# Patient Record
Sex: Female | Born: 1985 | Race: Black or African American | Hispanic: No | Smoking: Never smoker
Health system: Southern US, Community
[De-identification: ages and names within clinical notes are randomized; demographics above are authoritative.]

## PROBLEM LIST (undated history)

## (undated) ENCOUNTER — Inpatient Hospital Stay (HOSPITAL_COMMUNITY): Payer: Self-pay

## (undated) DIAGNOSIS — J45909 Unspecified asthma, uncomplicated: Secondary | ICD-10-CM

## (undated) DIAGNOSIS — A609 Anogenital herpesviral infection, unspecified: Secondary | ICD-10-CM

## (undated) DIAGNOSIS — O24419 Gestational diabetes mellitus in pregnancy, unspecified control: Secondary | ICD-10-CM

## (undated) DIAGNOSIS — I10 Essential (primary) hypertension: Secondary | ICD-10-CM

## (undated) HISTORY — PX: WISDOM TOOTH EXTRACTION: SHX21

---

## 2015-08-11 ENCOUNTER — Emergency Department (HOSPITAL_COMMUNITY)
Admission: EM | Admit: 2015-08-11 | Discharge: 2015-08-12 | Disposition: A | Discharge status: Home / Self Care | Payer: MEDICAID | Attending: Emergency Medicine | Admitting: Emergency Medicine

## 2015-08-11 ENCOUNTER — Emergency Department (HOSPITAL_COMMUNITY): Payer: MEDICAID

## 2015-08-11 ENCOUNTER — Encounter (HOSPITAL_COMMUNITY): Payer: Self-pay | Admitting: Emergency Medicine

## 2015-08-11 DIAGNOSIS — J45901 Unspecified asthma with (acute) exacerbation: Secondary | ICD-10-CM | POA: Insufficient documentation

## 2015-08-11 DIAGNOSIS — M79603 Pain in arm, unspecified: Secondary | ICD-10-CM | POA: Insufficient documentation

## 2015-08-11 DIAGNOSIS — Z880 Allergy status to penicillin: Secondary | ICD-10-CM | POA: Diagnosis not present

## 2015-08-11 DIAGNOSIS — I10 Essential (primary) hypertension: Secondary | ICD-10-CM | POA: Diagnosis not present

## 2015-08-11 DIAGNOSIS — R51 Headache: Secondary | ICD-10-CM | POA: Insufficient documentation

## 2015-08-11 DIAGNOSIS — F419 Anxiety disorder, unspecified: Secondary | ICD-10-CM | POA: Diagnosis not present

## 2015-08-11 DIAGNOSIS — R079 Chest pain, unspecified: Secondary | ICD-10-CM | POA: Diagnosis present

## 2015-08-11 DIAGNOSIS — M25519 Pain in unspecified shoulder: Secondary | ICD-10-CM | POA: Diagnosis not present

## 2015-08-11 DIAGNOSIS — F410 Panic disorder [episodic paroxysmal anxiety] without agoraphobia: Secondary | ICD-10-CM

## 2015-08-11 HISTORY — DX: Essential (primary) hypertension: I10

## 2015-08-11 HISTORY — DX: Unspecified asthma, uncomplicated: J45.909

## 2015-08-11 MED ORDER — IBUPROFEN 800 MG PO TABS
800.0000 mg | ORAL_TABLET | Freq: Once | ORAL | Status: AC
  Administered 2015-08-12: 800 mg via ORAL
  Filled 2015-08-11: qty 1

## 2015-08-11 NOTE — ED Notes (Signed)
Delay in blood draw, pt in radiology 

## 2015-08-11 NOTE — ED Notes (Signed)
Pt states she was stressed out tonight and began having chest pain. R sided chest numbness. SOB before but not now. Alert and oriented.
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
## 2015-08-12 LAB — I-STAT TROPONIN, ED: TROPONIN I, POC: 0 ng/mL (ref 0.00–0.08)

## 2015-08-12 MED ORDER — ALBUTEROL SULFATE HFA 108 (90 BASE) MCG/ACT IN AERS
2.0000 | INHALATION_SPRAY | RESPIRATORY_TRACT | Status: DC | PRN
  Administered 2015-08-12: 2 via RESPIRATORY_TRACT
  Filled 2015-08-12: qty 6.7

## 2015-08-12 NOTE — Discharge Instructions (Signed)
Your symptoms are likely due to stress or an anxiety attack.  It is unlikely to be due to heart complication.  Please use resources below to find a primary care provider for your regular health maintenance.  Use albuterol inhaler as needed for shortness of breath or wheezing.  Return to ER if your condition worsen or if you have other concerns.  Emergency Department Resource Guide 1) Find a Doctor and Pay Out of Pocket Although you won't have to find out who is covered by your insurance plan, it is a good idea to ask around and get recommendations. You will then need to call the office and see if the doctor you have chosen will accept you as a new patient and what types of options they offer for patients who are self-pay. Some doctors offer discounts or will set up payment plans for their patients who do not have insurance, but you will need to ask so you aren't surprised when you get to your appointment.  2) Contact Your Local Health Department Not all health departments have doctors that can see patients for sick visits, but many do, so it is worth a call to see if yours does. If you don't know where your local health department is, you can check in your phone book. The CDC also has a tool to help you locate your state's health department, and many state websites also have listings of all of their local health departments.  3) Find a Walk-in Clinic If your illness is not likely to be very severe or complicated, you may want to try a walk in clinic. These are popping up all over the country in pharmacies, drugstores, and shopping centers. They're usually staffed by nurse practitioners or physician assistants that have been trained to treat common illnesses and complaints. They're usually fairly quick and inexpensive. However, if you have serious medical issues or chronic medical problems, these are probably not your best option.  No Primary Care Doctor: - Call Health Connect at  [phone] - they can  help you locate a primary care doctor that  accepts your insurance, provides certain services, etc. - Physician Referral Service- [phone]  Chronic Pain Problems: Organization         Address  Phone   Notes  [facility]  [phone]) [phone] Patients need to be referred by their primary care doctor.   Medication Assistance: Organization         Address  Phone   Notes  [facility]ication [facility] [address] [place], [place] [address] [phone]) [phone] --Must be a resident of [facility] [place] -- Must have NO insurance coverage whatsoever (no Medicaid/ Medicare, etc.) -- The pt. MUST have a primary care doctor that directs their care regularly and follows them in the community   MedAssist  [phone]) [phone]   [employer]  [phone]) [phone]    Agencies that provide inexpensive medical care: Organization         Address  Phone   Notes  [facility]  [phone]   [facility]    [phone]   [facility] [address] [place], [place] [address] [phone]) [phone]   [facility] [address], [place] [phone]) [phone]   [facility]anned Parenthood    [phone](336) [phone]   [facility]    [phone](336) [phone]   [facility]  [address], [place] Phone:  [phone], Fax:  [phone] Hours of Operation:  9 am - 6 pm, M-F.  Also accepts Medicaid/Medicare and self-pay.  [facility] for [place], [address], [place] Phone: [phone], Fax: [phone]. Hours of Operation:  8:30 am - 5:30 pm, M-F.  Also accepts Medicaid and self-pay.  [facility] [address] [place] Phone: [phone]   [place], [place], [place] [phone], Ext. [phone] Mondays & Thursdays: 7-9 AM.  First 15 patients are seen on a first come, first serve basis.    [place] Providers:  Organization         Address  Phone   Notes  [facility] [address], [place] [phone] Also accepts self-pay patients.  [facility] [id number] [place], [place]  [phone]   [place], [address], [place] [phone]   [facility] [address] [place] [phone]   Lucianne Lei [address], [place]   [phone] Only accepts [place] Access [place] patients after they have their name applied to their card.   Self-Pay (no insurance) in [facility]:  Organization         Address  Phone   Notes  Sickle Cell Patients, [facility] [place] [phone]   [facility] [place] [phone]   [facility] [place]  [place], [place], [place] [phone]   [facility]/Dr. Osei-Bonsu  [address], [place] or [address], [place] [phone] Phone number for both [place] and [place] locations is the same.  Urgent Medical and [facility] [address] [place] [phone]   [facility] [address] [place] or [address] [phone]   [facility] [address], [place] [phone], phone; [phone], fax Sees patients 1st and 3rd Saturday of every month.  Must not qualify for public or private insurance (i.e. Medicaid, Medicare, [place] Health Choice, Veterans' Benefits)  Household income should be no more than 200% of the poverty level The clinic cannot treat you if you are pregnant or think you are pregnant  Sexually transmitted diseases are not treated at the clinic.    Dental Care: Organization         Address  Phone  Notes  [facility] [place] [phone] Accepts children up to age 31 who are enrolled in [place] or [place]; pregnant women with a Medicaid card; and children who have applied for Medicaid or [place] Health Choice, but were declined, whose parents can pay a reduced fee at time of service.  [facility] of [facility]  [address], [place] [phone] Accepts children up to age 34 who are enrolled in [place] or [place]; pregnant women with a Medicaid card; and children who have applied for Medicaid or [place] Health Choice, but were declined, whose parents can pay a reduced fee at time of service.  [place] Adult Dental Access PROGRAM  [place] [phone] Patients are seen by appointment only. Walk-ins  are not accepted. [place] will see patients 33 years of age and older. Monday - Tuesday (8am-5pm) Most Wednesdays (8:30-5pm) $30 per visit, cash only  [facility] Adult [facility] Access PROGRAM  [address], [facility] [phone] Patients are seen by appointment only. Walk-ins are not accepted. [place] will see patients 17 years of age and older. One Wednesday Evening (Monthly: Volunteer Based).  $30 per visit, cash only  [place]  [phone] for adults; Children under age 66, call Graduate Pediatric Dentistry at [phone]. Children aged 52-14, please call [phone] to request a pediatric application.  Dental services are provided in all areas of dental care including fillings, crowns and bridges, complete and partial dentures, implants, gum treatment, root canals, and extractions. Preventive care is also provided. Treatment is provided to both adults and children. Patients are selected via a lottery and there is often a waiting list.   [facility] [address] [place]  [phone] [email or web address]   [facility] [address] [place], [place] [phone], Ext. [phone] Second and Fourth Thursday of each month, opens at 6:30 AM; Clinic ends at 9 AM.  Patients are seen on a first-come first-served basis, and a limited number are seen during each clinic.   [facility]  [address] Hillard Danker [place], [place] [phone]   Eligibility Requirements You must have lived in [place], [place], or [place] counties for at least the last three months.   You cannot be eligible for state or federal sponsored [employer], including [employer], [place], or [employer].   You generally cannot be eligible for healthcare insurance through your employer.    How to apply: Eligibility screenings are held every Tuesday and Wednesday afternoon from 1:00 pm until 4:00 pm. You do not need an appointment for the interview!  [facility] [address], [place], [place]   [place]  [place] Department  [place]  [phone]    Behavioral Health Resources in the Community: Intensive Outpatient Programs Organization         Address  Phone  Notes  [place]. [address] [place], [place] [phone]   [facility] Outpatient [address] [place], [place]   ADS: Alcohol & Drug Svcs [address] [place], [place]   [place] [address],  [place], [place] or [phone]   Substance Abuse Resources Organization         Address  Phone  Notes  Alcohol and Drug Services  [phone]   [place]  [phone]   [facility] [place]   Chinita Pester  [phone]   Residential & Outpatient Substance Abuse Program  [phone]   Psychological Services Organization         Address  Phone  Notes  [facility] [place]  [place]  [phone]   [place] [address], [place] [phone] or [phone]    Mobile Crisis Teams Organization         Address  Phone  Notes  [facility]rapeutic Alternatives, Mobile Crisis Care Unit  [phone]   Assertive Psychotherapeutic Services  [address] [place], [place]   [facility] [address], [place] [phone]  Self-Help/Support Groups Organization         Address  Phone             Notes  Mental Health Assoc. of [place] - variety of support groups  [place] Call for more information  Narcotics Anonymous (NA), [facility] [address], [employer] [place]  2 meetings at this location   Special educational needs teacher         Address  Phone  Notes  ASAP Residential Treatment [place],    [place]  [phone]   [facility]  [address], [place] [address]27, [place], [place]   [place], [place] [phone] Admissions: 8am-3pm M-F  Incentives Substance [place] [address],    [employer], [place] [phone]   [facility] [address] [place], [place], [place]   [facility] [address]  [place], [place]   Insight Programs - Intensive Outpatient [address], [place], [place]   [facility] ([place] [place].,  [place], [place] [phone] or [phone]   [facility]idential Treatment Services (RTS) [address], [place], [place] Accepts Medicaid  [facility] [place] [address],  [place] [phone] Substance Abuse/Addiction Treatment   [facility] Organization         Address  Phone  Notes  [facility]  [phone]   Domenic Schwab, PhD [address] [place], [place]   [phone] or  [phone]   [place], [place] [phone]   [facility] [address], [place], [place] [phone] Insurance/Medicaid/sponsorship through [facility] and Families [address] [place]                                    [place], [place] [phone] [place] [address], [place] [phone]    Dr. Adele Schilder  [phone]   [facility] Dept. 1) [address], [place] 2) [place] 3)  [place] [address], [place] [phone]  [phone]   [place] [phone] or [phone] (After Hours)

## 2015-09-09 ENCOUNTER — Emergency Department (HOSPITAL_COMMUNITY)
Admission: EM | Admit: 2015-09-09 | Discharge: 2015-09-09 | Discharge status: Against Medical Advice | Payer: Medicaid Other | Attending: Emergency Medicine | Admitting: Emergency Medicine

## 2015-09-09 ENCOUNTER — Encounter (HOSPITAL_COMMUNITY): Payer: Self-pay | Admitting: Emergency Medicine

## 2015-09-09 DIAGNOSIS — R112 Nausea with vomiting, unspecified: Secondary | ICD-10-CM | POA: Insufficient documentation

## 2015-09-09 DIAGNOSIS — J45909 Unspecified asthma, uncomplicated: Secondary | ICD-10-CM | POA: Diagnosis not present

## 2015-09-09 DIAGNOSIS — R1030 Lower abdominal pain, unspecified: Secondary | ICD-10-CM | POA: Diagnosis not present

## 2015-09-09 DIAGNOSIS — R1010 Upper abdominal pain, unspecified: Secondary | ICD-10-CM | POA: Diagnosis not present

## 2015-09-09 DIAGNOSIS — I10 Essential (primary) hypertension: Secondary | ICD-10-CM | POA: Diagnosis not present

## 2015-09-09 LAB — CBC
HEMATOCRIT: 34.5 % — AB (ref 36.0–46.0)
HEMOGLOBIN: 12.4 g/dL (ref 12.0–15.0)
MCH: 32.2 pg (ref 26.0–34.0)
MCHC: 35.9 g/dL (ref 30.0–36.0)
MCV: 89.6 fL (ref 78.0–100.0)
PLATELETS: 242 10*3/uL (ref 150–400)
RBC: 3.85 MIL/uL — ABNORMAL LOW (ref 3.87–5.11)
RDW: 12.2 % (ref 11.5–15.5)
WBC: 5.1 10*3/uL (ref 4.0–10.5)

## 2015-09-09 LAB — URINALYSIS, ROUTINE W REFLEX MICROSCOPIC
GLUCOSE, UA: NEGATIVE mg/dL
Hgb urine dipstick: NEGATIVE
KETONES UR: 15 mg/dL — AB
Nitrite: NEGATIVE
PROTEIN: 30 mg/dL — AB
Specific Gravity, Urine: 1.039 — ABNORMAL HIGH (ref 1.005–1.030)
pH: 5.5 (ref 5.0–8.0)

## 2015-09-09 LAB — URINE MICROSCOPIC-ADD ON

## 2015-09-09 LAB — COMPREHENSIVE METABOLIC PANEL
ALT: 12 U/L — ABNORMAL LOW (ref 14–54)
ANION GAP: 10 (ref 5–15)
AST: 16 U/L (ref 15–41)
Albumin: 3.8 g/dL (ref 3.5–5.0)
Alkaline Phosphatase: 57 U/L (ref 38–126)
BILIRUBIN TOTAL: 0.8 mg/dL (ref 0.3–1.2)
BUN: 9 mg/dL (ref 6–20)
CO2: 24 mmol/L (ref 22–32)
Calcium: 9 mg/dL (ref 8.9–10.3)
Chloride: 106 mmol/L (ref 101–111)
Creatinine, Ser: 0.72 mg/dL (ref 0.44–1.00)
GFR calc Af Amer: >60 mL/min (ref >60)
Glucose, Bld: 98 mg/dL (ref 65–99)
POTASSIUM: 3.6 mmol/L (ref 3.5–5.1)
Sodium: 140 mmol/L (ref 135–145)
TOTAL PROTEIN: 6.8 g/dL (ref 6.5–8.1)

## 2015-09-09 LAB — I-STAT BETA HCG BLOOD, ED (MC, WL, AP ONLY): HCG, QUANTITATIVE: 1366.9 m[IU]/mL — AB (ref <5)

## 2015-09-09 LAB — LIPASE, BLOOD: Lipase: 29 U/L (ref 11–51)

## 2015-09-09 NOTE — ED Notes (Signed)
Pt states for the last week she has been having lower abd cramping and upper abd pain. Pt also states she has been having n/v. Pt thinks she is pregnant unknown how far along or when last menstrual period was.

## 2015-11-17 LAB — OB RESULTS CONSOLE RUBELLA ANTIBODY, IGM: Rubella: IMMUNE

## 2015-11-17 LAB — OB RESULTS CONSOLE HIV ANTIBODY (ROUTINE TESTING): HIV: NONREACTIVE

## 2015-11-17 LAB — OB RESULTS CONSOLE ABO/RH: RH TYPE: NEGATIVE

## 2015-11-17 LAB — OB RESULTS CONSOLE RPR: RPR: NONREACTIVE

## 2015-11-17 LAB — OB RESULTS CONSOLE HEPATITIS B SURFACE ANTIGEN: HEP B S AG: NEGATIVE

## 2015-11-17 LAB — OB RESULTS CONSOLE GC/CHLAMYDIA
Chlamydia: NEGATIVE
Gonorrhea: NEGATIVE

## 2015-11-17 LAB — OB RESULTS CONSOLE ANTIBODY SCREEN: ANTIBODY SCREEN: NEGATIVE

## 2015-12-17 ENCOUNTER — Inpatient Hospital Stay (HOSPITAL_COMMUNITY): Payer: Medicaid Other

## 2015-12-17 ENCOUNTER — Encounter (HOSPITAL_COMMUNITY): Payer: Self-pay | Admitting: *Deleted

## 2015-12-17 ENCOUNTER — Inpatient Hospital Stay (HOSPITAL_COMMUNITY)
Admission: AD | Admit: 2015-12-17 | Discharge: 2015-12-17 | Disposition: A | Discharge status: Home / Self Care | Payer: Medicaid Other | Source: Ambulatory Visit | Attending: Obstetrics and Gynecology | Admitting: Obstetrics and Gynecology

## 2015-12-17 DIAGNOSIS — M549 Dorsalgia, unspecified: Secondary | ICD-10-CM | POA: Diagnosis not present

## 2015-12-17 DIAGNOSIS — Z3A19 19 weeks gestation of pregnancy: Secondary | ICD-10-CM | POA: Diagnosis not present

## 2015-12-17 DIAGNOSIS — R102 Pelvic and perineal pain: Secondary | ICD-10-CM | POA: Insufficient documentation

## 2015-12-17 DIAGNOSIS — Z880 Allergy status to penicillin: Secondary | ICD-10-CM | POA: Diagnosis not present

## 2015-12-17 DIAGNOSIS — R58 Hemorrhage, not elsewhere classified: Secondary | ICD-10-CM

## 2015-12-17 DIAGNOSIS — O26852 Spotting complicating pregnancy, second trimester: Secondary | ICD-10-CM | POA: Diagnosis present

## 2015-12-17 DIAGNOSIS — O4692 Antepartum hemorrhage, unspecified, second trimester: Secondary | ICD-10-CM

## 2015-12-17 LAB — WET PREP, GENITAL
CLUE CELLS WET PREP: NONE SEEN
Sperm: NONE SEEN
TRICH WET PREP: NONE SEEN
Yeast Wet Prep HPF POC: NONE SEEN

## 2015-12-17 MED ORDER — IBUPROFEN 600 MG PO TABS
600.0000 mg | ORAL_TABLET | Freq: Once | ORAL | Status: AC | PRN
  Administered 2015-12-17: 600 mg via ORAL
  Filled 2015-12-17: qty 1

## 2015-12-17 NOTE — MAU Provider Note (Signed)
Katherine Serrano, Katherine Serrano is a  29yo, [id number], at 19.4wks presenting to MAU unannounced for vaginal bleeding two days ago and lower abdominal sharp pain that is worse with sudden movement.  Reports sexual intercourse a month ago.   Denies contractions or lof.  Reports some discharge that smells "like blood".    History     There are no active problems to display for this patient.   Chief Complaint  Patient presents with  . Back Pain  . Abdominal Pain   HPI  OB History    Gravida Para Term Preterm AB TAB SAB Ectopic Multiple Living   Past Medical History  Diagnosis Date  . Asthma     Past Surgical History  Procedure Laterality Date  . No past surgeries      History reviewed. No pertinent family history.  Social History  Substance Use Topics  . Smoking status: Never Smoker   . Smokeless tobacco: None  . Alcohol Use: No    Allergies:  Allergies  Allergen Reactions  . Amoxicillin Anaphylaxis and Hives    Has patient had a PCN reaction causing immediate rash, facial/tongue/throat swelling, SOB or lightheadedness with hypotension: Yes Has patient had a PCN reaction causing severe rash involving mucus membranes or skin necrosis: No Has patient had a PCN reaction that required hospitalization Yes Has patient had a PCN reaction occurring within the last 10 years: Yes If all of the above answers are "NO", then may proceed with Cephalosporin use.   Marland Kitchen Penicillins     No prescriptions prior to admission    ROS Physical Exam   Blood pressure 107/63, pulse 55, temperature 97.7 F (36.5 C), temperature source Oral, resp. rate 18, SpO2 99 %.   Results for orders placed or performed during the hospital encounter of 12/17/15 (from the past 24 hour(s))  Wet prep, genital     Status: Abnormal   Collection Time: 12/17/15  5:22 PM  Result Value Ref Range   Yeast Wet Prep HPF POC NONE SEEN NONE SEEN   Trich, Wet Prep NONE SEEN NONE SEEN   Clue Cells Wet Prep  HPF POC NONE SEEN NONE SEEN   WBC, Wet Prep HPF POC MANY (A) NONE SEEN   Sperm NONE SEEN     [place]:   Cephalic, Anterior placenta above cervical os,   AFI subjectively within normal limits. No placental abruption or previa identified, gesational age [redacted]w[redacted]d, cervix 3.2 cm SVE:  0/0/-4, no blood noted in vaginal vault with sterile speculum exam UC : None FHT: 148 bpm, per doppler   Physical Exam  Constitutional: She is oriented to person, place, and time. She appears well-developed.  HENT:  Head: Normocephalic.  Eyes: Pupils are equal, round, and reactive to light.  Neck: Normal range of motion.  Cardiovascular: Normal rate and regular rhythm.   Respiratory: Effort normal.  GI: Soft.  Genitourinary: Vagina normal.  Musculoskeletal: Normal range of motion.  Neurological: She is alert and oriented to person, place, and time.  Skin: Skin is warm and dry.  Psychiatric: She has a normal mood and affect. Her behavior is normal. Judgment and thought content normal.    ED Course  Assessment:  IUP 19.4 wks  Round ligament pain Vaginal spotting FHT 145 bpm via doppler No contractions Wet prep negative [place]: no placental previa or abruption identified  Plan: DC home Bleeding precautions  Go to scheduled prenatal visit Call  PRN  Tylenol 650mg  PRN for pain   Alphonzo SeveranceRachel Marwa Fuhrman CNM, MSN 12/17/2015 8:24 PM

## 2015-12-17 NOTE — Discharge Instructions (Signed)
Vaginal Bleeding During Pregnancy, Second Trimester A small amount of bleeding (spotting) from the vagina is relatively common in pregnancy. It usually stops on its own. Various things can cause bleeding or spotting in pregnancy. Some bleeding may be related to the pregnancy, and some may not. Sometimes the bleeding is normal and is not a problem. However, bleeding can also be a sign of something serious. Be sure to tell your health care provider about any vaginal bleeding right away. Some possible causes of vaginal bleeding during the second trimester include:  Infection, inflammation, or growths on the cervix.   The placenta may be partially or completely covering the opening of the cervix inside the uterus (placenta previa).  The placenta may have separated from the uterus (abruption of the placenta).   You may be having early (preterm) labor.   The cervix may not be strong enough to keep a baby inside the uterus (cervical insufficiency).   Tiny cysts may have developed in the uterus instead of pregnancy tissue (molar pregnancy). HOME CARE INSTRUCTIONS  Watch your condition for any changes. The following actions may help to lessen any discomfort you are feeling:  Follow your health care provider's instructions for limiting your activity. If your health care provider orders bed rest, you may need to stay in bed and only get up to use the bathroom. However, your health care provider may allow you to continue light activity.  If needed, make plans for someone to help with your regular activities and responsibilities while you are on bed rest.  Keep track of the number of pads you use each day, how often you change pads, and how soaked (saturated) they are. Write this down.  Do not use tampons. Do not douche.  Do not have sexual intercourse or orgasms until approved by your health care provider.  If you pass any tissue from your vagina, save the tissue so you can show it to your  health care provider.  Only take over-the-counter or prescription medicines as directed by your health care provider.  Do not take aspirin because it can make you bleed.  Do not exercise or perform any strenuous activities or heavy lifting without your health care provider's permission.  Keep all follow-up appointments as directed by your health care provider. SEEK MEDICAL CARE IF:  You have any vaginal bleeding during any part of your pregnancy.  You have cramps or labor pains.  You have a fever, not controlled by medicine. SEEK IMMEDIATE MEDICAL CARE IF:   You have severe cramps in your back or belly (abdomen).  You have contractions.  You have chills.  You pass large clots or tissue from your vagina.  Your bleeding increases.  You feel light-headed or weak, or you have fainting episodes.  You are leaking fluid or have a gush of fluid from your vagina. MAKE SURE YOU:  Understand these instructions.  Will watch your condition.  Will get help right away if you are not doing well or get worse.   This information is not intended to replace advice given to you by your health care provider. Make sure you discuss any questions you have with your health care provider.   Document Released: 05/24/2005 Document Revised: 08/19/2013 Document Reviewed: 04/21/2013 Elsevier Interactive Patient Education 2016 [employer] Inc[employer]  

## 2015-12-17 NOTE — MAU Note (Signed)
Pt states she has been experiencing lower abd and lower back pain like cramping for several days.  Pt states she had vaginal spotting three days ago.  Increased vaginal discharge without odor, itching or burning.  Pt states she feels flutters in abd, no movement yet.

## 2015-12-20 LAB — GC/CHLAMYDIA PROBE AMP (~~LOC~~) NOT AT ARMC
Chlamydia: NEGATIVE
NEISSERIA GONORRHEA: NEGATIVE

## 2015-12-23 ENCOUNTER — Encounter (HOSPITAL_COMMUNITY): Payer: Self-pay | Admitting: Emergency Medicine

## 2015-12-23 ENCOUNTER — Encounter (HOSPITAL_COMMUNITY): Payer: Self-pay

## 2016-03-27 ENCOUNTER — Encounter (HOSPITAL_COMMUNITY): Payer: Self-pay | Admitting: *Deleted

## 2016-03-27 ENCOUNTER — Inpatient Hospital Stay (HOSPITAL_COMMUNITY)
Admission: AD | Admit: 2016-03-27 | Discharge: 2016-03-27 | Disposition: A | Discharge status: Home / Self Care | Payer: Medicaid Other | Source: Ambulatory Visit | Attending: Obstetrics and Gynecology | Admitting: Obstetrics and Gynecology

## 2016-03-27 ENCOUNTER — Encounter: Payer: Self-pay | Admitting: Advanced Practice Midwife

## 2016-03-27 DIAGNOSIS — Z3A32 32 weeks gestation of pregnancy: Secondary | ICD-10-CM | POA: Diagnosis not present

## 2016-03-27 DIAGNOSIS — O4703 False labor before 37 completed weeks of gestation, third trimester: Secondary | ICD-10-CM | POA: Insufficient documentation

## 2016-03-27 DIAGNOSIS — O9989 Other specified diseases and conditions complicating pregnancy, childbirth and the puerperium: Secondary | ICD-10-CM

## 2016-03-27 DIAGNOSIS — Z880 Allergy status to penicillin: Secondary | ICD-10-CM | POA: Insufficient documentation

## 2016-03-27 DIAGNOSIS — N898 Other specified noninflammatory disorders of vagina: Secondary | ICD-10-CM | POA: Insufficient documentation

## 2016-03-27 DIAGNOSIS — M549 Dorsalgia, unspecified: Secondary | ICD-10-CM

## 2016-03-27 DIAGNOSIS — O26893 Other specified pregnancy related conditions, third trimester: Secondary | ICD-10-CM | POA: Insufficient documentation

## 2016-03-27 LAB — URINE MICROSCOPIC-ADD ON: RBC / HPF: NONE SEEN RBC/hpf (ref 0–5)

## 2016-03-27 LAB — URINALYSIS, ROUTINE W REFLEX MICROSCOPIC
BILIRUBIN URINE: NEGATIVE
GLUCOSE, UA: NEGATIVE mg/dL
HGB URINE DIPSTICK: NEGATIVE
KETONES UR: NEGATIVE mg/dL
Nitrite: NEGATIVE
PROTEIN: NEGATIVE mg/dL
Specific Gravity, Urine: 1.025 (ref 1.005–1.030)
pH: 6 (ref 5.0–8.0)

## 2016-03-27 LAB — WET PREP, GENITAL
Clue Cells Wet Prep HPF POC: NONE SEEN
Sperm: NONE SEEN
Trich, Wet Prep: NONE SEEN
Yeast Wet Prep HPF POC: NONE SEEN

## 2016-03-27 LAB — AMNISURE RUPTURE OF MEMBRANE (ROM) NOT AT ARMC: Amnisure ROM: NEGATIVE

## 2016-03-27 NOTE — Discharge Instructions (Signed)
Braxton Hicks Contractions °Contractions of the uterus can occur throughout pregnancy. Contractions are not always a sign that you are in labor.  °WHAT ARE BRAXTON HICKS CONTRACTIONS?  °Contractions that occur before labor are called Braxton Hicks contractions, or false labor. Toward the end of pregnancy (32-34 weeks), these contractions can develop more often and may become more forceful. This is not true labor because these contractions do not result in opening (dilatation) and thinning of the cervix. They are sometimes difficult to tell apart from true labor because these contractions can be forceful and people have different pain tolerances. You should not feel embarrassed if you go to the hospital with false labor. Sometimes, the only way to tell if you are in true labor is for your health care provider to look for changes in the cervix. °If there are no prenatal problems or other health problems associated with the pregnancy, it is completely safe to be sent home with false labor and await the onset of true labor. °HOW CAN YOU TELL THE DIFFERENCE BETWEEN TRUE AND FALSE LABOR? °False Labor °· The contractions of false labor are usually shorter and not as hard as those of true labor.   °· The contractions are usually irregular.   °· The contractions are often felt in the front of the lower abdomen and in the groin.   °· The contractions may go away when you walk around or change positions while lying down.   °· The contractions get weaker and are shorter lasting as time goes on.   °· The contractions do not usually become progressively stronger, regular, and closer together as with true labor.   °True Labor °· Contractions in true labor last 30-70 seconds, become very regular, usually become more intense, and increase in frequency.   °· The contractions do not go away with walking.   °· The discomfort is usually felt in the top of the uterus and spreads to the lower abdomen and low back.   °· True labor can be  determined by your health care provider with an exam. This will show that the cervix is dilating and getting thinner.   °WHAT TO REMEMBER °· Keep up with your usual exercises and follow other instructions given by your health care provider.   °· Take medicines as directed by your health care provider.   °· Keep your regular prenatal appointments.   °· Eat and drink lightly if you think you are going into labor.   °· If Braxton Hicks contractions are making you uncomfortable:   °· Change your position from lying down or resting to walking, or from walking to resting.   °· Sit and rest in a tub of warm water.   °· Drink 2-3 glasses of water. Dehydration may cause these contractions.   °· Do slow and deep breathing several times an hour.   °WHEN SHOULD I SEEK IMMEDIATE MEDICAL CARE? °Seek immediate medical care if: °· Your contractions become stronger, more regular, and closer together.   °· You have fluid leaking or gushing from your vagina.   °· You have a fever.   °· You pass blood-tinged mucus.   °· You have vaginal bleeding.   °· You have continuous abdominal pain.   °· You have low back pain that you never had before.   °· You feel your baby's head pushing down and causing pelvic pressure.   °· Your baby is not moving as much as it used to.   °  °This information is not intended to replace advice given to you by your health care provider. Make sure you discuss any questions you have with your health care   provider. °  °Document Released: 08/14/2005 Document Revised: 08/19/2013 Document Reviewed: 05/26/2013 °Elsevier Interactive Patient Education ©2016 [employer] ° °Back Pain in Pregnancy °Back pain during pregnancy is common. It happens in about half of all pregnancies. It is important for you and your baby that you remain active during your pregnancy. If you feel that back pain is not allowing you to remain active or sleep well, it is time to see your caregiver. Back pain may be caused by several factors  related to changes during your pregnancy. Fortunately, unless you had trouble with your back before your pregnancy, the pain is likely to get better after you deliver. °Low back pain usually occurs between the fifth and seventh months of pregnancy. It can, however, happen in the first couple months. Factors that increase the risk of back problems include:  °· Previous back problems. °· Injury to your back. °· Having twins or multiple births. °· A chronic cough. °· Stress. °· Job-related repetitive motions. °· Muscle or spinal disease in the back. °· Family history of back problems, ruptured (herniated) discs, or osteoporosis. °· Depression, anxiety, and panic attacks. °CAUSES  °· When you are pregnant, your body produces a hormone called relaxin. This hormone makes the ligaments connecting the low back and pubic bones more flexible. This flexibility allows the baby to be delivered more easily. When your ligaments are loose, your muscles need to work harder to support your back. Soreness in your back can come from tired muscles. Soreness can also come from back tissues that are irritated since they are receiving less support. °· As the baby grows, it puts pressure on the nerves and blood vessels in your pelvis. This can cause back pain. °· As the baby grows and gets heavier during pregnancy, the uterus pushes the stomach muscles forward and changes your center of gravity. This makes your back muscles work harder to maintain good posture. °SYMPTOMS  °Lumbar pain during pregnancy °Lumbar pain during pregnancy usually occurs at or above the waist in the center of the back. There may be pain and numbness that radiates into your leg or foot. This is similar to low back pain experienced by non-pregnant women. It usually increases with sitting for long periods of time, standing, or repetitive lifting. Tenderness may also be present in the muscles along your upper back. °Posterior pelvic pain during pregnancy °Pain in the  back of the pelvis is more common than lumbar pain in pregnancy. It is a deep pain felt in your side at the waistline, or across the tailbone (sacrum), or in both places. You may have pain on one or both sides. This pain can also go into the buttocks and backs of the upper thighs. Pubic and groin pain may also be present. The pain does not quickly resolve with rest, and morning stiffness may also be present. °Pelvic pain during pregnancy can be brought on by most activities. A high level of fitness before and during pregnancy may or may not prevent this problem. Labor pain is usually 1 to 2 minutes apart, lasts for about 1 minute, and involves a bearing down feeling or pressure in your pelvis. However, if you are at term with the pregnancy, constant low back pain can be the beginning of early labor, and you should be aware of this. °DIAGNOSIS  °X-rays of the back should not be done during the first 12 to 14 weeks of the pregnancy and only when absolutely necessary during the rest of the pregnancy. MRIs do not give   off radiation and are safe during pregnancy. MRIs also should only be done when absolutely necessary. °HOME CARE INSTRUCTIONS °· Exercise as directed by your caregiver. Exercise is the most effective way to prevent or manage back pain. If you have a back problem, it is especially important to avoid sports that require sudden body movements. Swimming and walking are great activities. °· Do not stand in one place for long periods of time. °· Do not wear high heels. °· Sit in chairs with good posture. Use a pillow on your lower back if necessary. Make sure your head rests over your shoulders and is not hanging forward. °· Try sleeping on your side, preferably the left side, with a pillow or two between your legs. If you are sore after a night's rest, your bed may be too soft. Try placing a board between your mattress and box spring. °· Listen to your body when lifting. If you are experiencing pain, ask for  help or try bending your knees more so you can use your leg muscles rather than your back muscles. Squat down when picking up something from the floor. Do not bend over. °· Eat a healthy diet. Try to gain weight within your caregiver's recommendations. °· Use heat or cold packs 3 to 4 times a day for 15 minutes to help with the pain. °· Only take over-the-counter or prescription medicines for pain, discomfort, or fever as directed by your caregiver. °Sudden (acute) back pain °· Use bed rest for only the most extreme, acute episodes of back pain. Prolonged bed rest over 48 hours will aggravate your condition. °· Ice is very effective for acute conditions. °¨ Put ice in a plastic bag. °¨ Place a towel between your skin and the bag. °¨ Leave the ice on for 10 to 20 minutes every 2 hours, or as needed. °· Using heat packs for 30 minutes prior to activities is also helpful. °Continued back pain °See your caregiver if you have continued problems. Your caregiver can help or refer you for appropriate physical therapy. With conditioning, most back problems can be avoided. Sometimes, a more serious issue may be the cause of back pain. You should be seen right away if new problems seem to be developing. Your caregiver may recommend: °· A maternity girdle. °· An elastic sling. °· A back brace. °· A massage therapist or acupuncture. °SEEK MEDICAL CARE IF:  °· You are not able to do most of your daily activities, even when taking the pain medicine you were given. °· You need a referral to a physical therapist or chiropractor. °· You want to try acupuncture. °SEEK IMMEDIATE MEDICAL CARE IF: °· You develop numbness, tingling, weakness, or problems with the use of your arms or legs. °· You develop severe back pain that is no longer relieved with medicines. °· You have a sudden change in bowel or bladder control. °· You have increasing pain in other areas of the body. °· You develop shortness of breath, dizziness, or fainting. °· You  develop nausea, vomiting, or sweating. °· You have back pain which is similar to labor pains. °· You have back pain along with your water breaking or vaginal bleeding. °· You have back pain or numbness that travels down your leg. °· Your back pain developed after you fell. °· You develop pain on one side of your back. You may have a kidney stone. °· You see blood in your urine. You may have a bladder infection or kidney stone. °· You have back pain with   blisters. You may have shingles. °Back pain is fairly common during pregnancy but should not be accepted as just part of the process. Back pain should always be treated as soon as possible. This will make your pregnancy as pleasant as possible. °  °This information is not intended to replace advice given to you by your health care provider. Make sure you discuss any questions you have with your health care provider. °  °Document Released: 11/22/2005 Document Revised: 11/06/2011 Document Reviewed: 01/03/2011 °Elsevier Interactive Patient Education ©2016 Elsevier Inc. ° ° °

## 2016-03-27 NOTE — MAU Note (Signed)
Pt C/O lower abd & back pain since last week, has been taking tylenol PM, wasn't helping.  Was advised to come in last week but did not come in.  Has been leaking clear liquid also for the last week.  Denies bleeding.

## 2016-03-27 NOTE — MAU Provider Note (Signed)
Chief Complaint:  Abdominal Pain; Back Pain; and Vaginal Discharge   None     HPI: Katherine Serrano is a 30 y.o. G3P2000 at 44w4dwho presents to maternity admissions reporting abdominal cramping, back pain, and watery vaginal discharge all starting ~2 weeks ago.  She reports the abdominal cramping is intermittent, irregular, and worsening over time.  She has not tried any treatments for her pain, and reports she is drinking water but maybe not enough.  She has not tried any treatments for her discharge/LOF and reports it is light, not requiring a pantyliner but wetting her underwear occasionally and causing vaginal irritation.. She reports good fetal movement, denies vaginal bleeding, vaginal itching/burning, urinary symptoms, h/a, dizziness, n/v, or fever/chills.    HPI  Past Medical History: Past Medical History:  Diagnosis Date  . Asthma   . Hypertension     Past obstetric history: OB History  Gravida Para Term Preterm AB Living  0 0 2  SAB TAB Ectopic Multiple Live Births  0 0 0   2    # Outcome Date GA Lbr Len/2nd Weight Sex Delivery Anes PTL Lv  3 Current           2 Term         LIV  1 Term         LIV      Past Surgical History: Past Surgical History:  Procedure Laterality Date  . WISDOM TOOTH EXTRACTION      Family History: History reviewed. No pertinent family history.  Social History: Social History  Substance Use Topics  . Smoking status: Never Smoker  . Smokeless tobacco: Never Used  . Alcohol use No    Allergies:  Allergies  Allergen Reactions  . Amoxicillin Anaphylaxis and Hives    Has patient had a PCN reaction causing immediate rash, facial/tongue/throat swelling, SOB or lightheadedness with hypotension: Yes Has patient had a PCN reaction causing severe rash involving mucus membranes or skin necrosis: No Has patient had a PCN reaction that required hospitalization Yes Has patient had a PCN reaction occurring within the last 10 years:  Yes If all of the above answers are "NO", then may proceed with Cephalosporin use.   . Latex Itching    infection  . Penicillins   . Amoxicillin Rash  . Penicillins Rash        Meds:  Prescriptions Prior to Admission  Medication Sig Dispense Refill Last Dose  . acetaminophen (TYLENOL) 500 MG tablet Take 500 mg by mouth every 6 (six) hours as needed for mild pain.   03/26/2016 at Unknown time  . Prenatal Vit-Fe Fumarate-FA (PRENATAL MULTIVITAMIN) TABS tablet Take 1 tablet by mouth daily at 12 noon.   03/26/2016 at Unknown time    ROS:  Review of Systems  Constitutional: Negative for chills, fatigue and fever.  Eyes: Negative for visual disturbance.  Respiratory: Negative for shortness of breath.   Cardiovascular: Negative for chest pain.  Gastrointestinal: Positive for abdominal pain. Negative for nausea and vomiting.  Genitourinary: Positive for pelvic pain and vaginal discharge. Negative for difficulty urinating, dysuria, flank pain, vaginal bleeding and vaginal pain.  Musculoskeletal: Positive for back pain.  Neurological: Negative for dizziness and headaches.  Psychiatric/Behavioral: Negative.      I have reviewed patient's Past Medical Hx, Surgical Hx, Family Hx, Social Hx, medications and allergies.   Physical Exam   Patient Vitals for the past 24 hrs:  BP Temp Temp src Pulse Resp  03/27/16 1713  95/55 98 F (36.7 C) Oral 74 16   Constitutional: Well-developed, well-nourished female in no acute distress.  Cardiovascular: normal rate Respiratory: normal effort GI: Abd soft, non-tender, gravid appropriate for gestational age.  MS: Extremities nontender, no edema, normal ROM Neurologic: Alert and oriented x 4.  GU: Neg CVAT.  PELVIC EXAM: Cervix pink, visually closed, without lesion, scant white creamy discharge, vaginal walls and external genitalia normal Bimanual exam: Cervix 0/long/high, firm, anterior, neg CMT, uterus nontender, nonenlarged, adnexa without  tenderness, enlargement, or mass  Dilation: Closed Effacement (%): Thick Cervical Position: Posterior Exam by:: L. Leftwich-Kirby CNM  FHT:  Baseline 140 , moderate variability, accelerations present, no decelerations Contractions: None on toco or to palpation   Labs: Results for orders placed or performed during the hospital encounter of 03/27/16 (from the past 24 hour(s))  Urinalysis, Routine w reflex microscopic (not at [facility])     Status: Abnormal   Collection Time: 03/27/16  5:57 PM  Result Value Ref Range   Color, Urine YELLOW YELLOW   APPearance CLEAR CLEAR   Specific Gravity, Urine 1.025 1.005 - 1.030   pH 6.0 5.0 - 8.0   Glucose, UA NEGATIVE NEGATIVE mg/dL   Hgb urine dipstick NEGATIVE NEGATIVE   Bilirubin Urine NEGATIVE NEGATIVE   Ketones, ur NEGATIVE NEGATIVE mg/dL   Protein, ur NEGATIVE NEGATIVE mg/dL   Nitrite NEGATIVE NEGATIVE   Leukocytes, UA TRACE (A) NEGATIVE  Urine microscopic-add on     Status: Abnormal   Collection Time: 03/27/16  5:57 PM  Result Value Ref Range   Squamous Epithelial / LPF 6-30 (A) NONE SEEN   WBC, UA 0-5 0 - 5 WBC/hpf   RBC / HPF NONE SEEN 0 - 5 RBC/hpf   Bacteria, UA FEW (A) NONE SEEN   Crystals CA OXALATE CRYSTALS (A) NEGATIVE   Urine-Other MUCOUS PRESENT   Amnisure rupture of membrane (rom)not at [facility]     Status: None   Collection Time: 03/27/16  6:26 PM  Result Value Ref Range   Amnisure ROM NEGATIVE   Wet prep, genital     Status: Abnormal   Collection Time: 03/27/16  6:26 PM  Result Value Ref Range   Yeast Wet Prep HPF POC NONE SEEN NONE SEEN   Trich, Wet Prep NONE SEEN NONE SEEN   Clue Cells Wet Prep HPF POC NONE SEEN NONE SEEN   WBC, Wet Prep HPF POC MANY (A) NONE SEEN   Sperm NONE SEEN       Imaging:  No results found.  MAU Course/MDM: I have ordered labs (amnisure, wet prep, GCC, U/A) and reviewed results.  No evidence of preterm labor, ROM, or vaginal infection on exam/labs.  Consult Dr Su Hilt.  D/C home with  preterm labor precautions.  Rest/ice/heat/warm bath/Tylenol/pregnancy support belt for pain. Pt stable at time of discharge.  Assessment: 1. Threatened preterm labor, third trimester   2. Vaginal discharge during pregnancy in third trimester   3. Back pain affecting pregnancy in third trimester     Plan: Discharge home Labor precautions and fetal kick counts  Follow-up Information    Purcell Nails, MD .   Specialty:  Obstetrics and Gynecology Why:  As scheduled, return to MAU as needed for emergencies Contact information: [address] [place] [address] [phone]            Medication List    TAKE these medications   acetaminophen 500 MG tablet Commonly known as:  TYLENOL Take 500 mg by mouth  every 6 (six) hours as needed for mild pain.   prenatal multivitamin Tabs tablet Take 1 tablet by mouth daily at 12 noon.       Sharen Counter Certified Nurse-Midwife 03/27/2016 7:18 PM

## 2016-03-28 LAB — GC/CHLAMYDIA PROBE AMP (~~LOC~~) NOT AT ARMC
CHLAMYDIA, DNA PROBE: NEGATIVE
NEISSERIA GONORRHEA: NEGATIVE

## 2016-05-05 ENCOUNTER — Inpatient Hospital Stay (HOSPITAL_COMMUNITY)
Admission: AD | Admit: 2016-05-05 | Discharge: 2016-05-05 | Disposition: A | Discharge status: Home / Self Care | Payer: Medicaid Other | Source: Ambulatory Visit | Attending: Obstetrics and Gynecology | Admitting: Obstetrics and Gynecology

## 2016-05-05 ENCOUNTER — Other Ambulatory Visit: Payer: Self-pay | Admitting: Obstetrics and Gynecology

## 2016-05-05 ENCOUNTER — Encounter (HOSPITAL_COMMUNITY): Payer: Self-pay | Admitting: *Deleted

## 2016-05-05 DIAGNOSIS — Z3A38 38 weeks gestation of pregnancy: Secondary | ICD-10-CM | POA: Insufficient documentation

## 2016-05-05 DIAGNOSIS — Z3493 Encounter for supervision of normal pregnancy, unspecified, third trimester: Secondary | ICD-10-CM | POA: Diagnosis present

## 2016-05-05 NOTE — MAU Note (Signed)
Pt has abdominal pain that is constant since yesterday, she said "i wondered if it was something I ate".  Also has some back pain.  Denies constipation diarrhea lof vb.

## 2016-05-08 ENCOUNTER — Telehealth (HOSPITAL_COMMUNITY): Payer: Self-pay | Admitting: *Deleted

## 2016-05-08 ENCOUNTER — Encounter (HOSPITAL_COMMUNITY): Payer: Self-pay

## 2016-05-08 NOTE — Telephone Encounter (Signed)
Preadmission screen  

## 2016-05-09 ENCOUNTER — Encounter (HOSPITAL_COMMUNITY)
Admission: RE | Admit: 2016-05-09 | Discharge: 2016-05-09 | Disposition: A | Discharge status: Home / Self Care | Payer: Medicaid Other | Source: Ambulatory Visit | Attending: Obstetrics and Gynecology | Admitting: Obstetrics and Gynecology

## 2016-05-09 ENCOUNTER — Inpatient Hospital Stay (HOSPITAL_COMMUNITY)
Admission: AD | Admit: 2016-05-09 | Discharge: 2016-05-09 | Disposition: A | Discharge status: Home / Self Care | Payer: Medicaid Other | Source: Ambulatory Visit | Attending: Obstetrics and Gynecology | Admitting: Obstetrics and Gynecology

## 2016-05-09 ENCOUNTER — Inpatient Hospital Stay (HOSPITAL_COMMUNITY): Payer: Medicaid Other

## 2016-05-09 ENCOUNTER — Encounter (HOSPITAL_COMMUNITY): Payer: Self-pay | Admitting: *Deleted

## 2016-05-09 DIAGNOSIS — O9A213 Injury, poisoning and certain other consequences of external causes complicating pregnancy, third trimester: Secondary | ICD-10-CM | POA: Diagnosis not present

## 2016-05-09 DIAGNOSIS — W1839XA Other fall on same level, initial encounter: Secondary | ICD-10-CM | POA: Diagnosis not present

## 2016-05-09 DIAGNOSIS — Y939 Activity, unspecified: Secondary | ICD-10-CM | POA: Diagnosis not present

## 2016-05-09 DIAGNOSIS — O4593 Premature separation of placenta, unspecified, third trimester: Secondary | ICD-10-CM

## 2016-05-09 DIAGNOSIS — Z3A38 38 weeks gestation of pregnancy: Secondary | ICD-10-CM | POA: Insufficient documentation

## 2016-05-09 DIAGNOSIS — T1490 Injury, unspecified: Secondary | ICD-10-CM

## 2016-05-09 DIAGNOSIS — O26893 Other specified pregnancy related conditions, third trimester: Secondary | ICD-10-CM | POA: Insufficient documentation

## 2016-05-09 DIAGNOSIS — O288 Other abnormal findings on antenatal screening of mother: Secondary | ICD-10-CM

## 2016-05-09 DIAGNOSIS — W19XXXA Unspecified fall, initial encounter: Secondary | ICD-10-CM

## 2016-05-09 HISTORY — DX: Anogenital herpesviral infection, unspecified: A60.9

## 2016-05-09 HISTORY — DX: Gestational diabetes mellitus in pregnancy, unspecified control: O24.419

## 2016-05-09 LAB — CBC
HEMATOCRIT: 33.2 % — AB (ref 36.0–46.0)
Hemoglobin: 11.6 g/dL — ABNORMAL LOW (ref 12.0–15.0)
MCH: 32.1 pg (ref 26.0–34.0)
MCHC: 34.9 g/dL (ref 30.0–36.0)
MCV: 92 fL (ref 78.0–100.0)
PLATELETS: 210 10*3/uL (ref 150–400)
RBC: 3.61 MIL/uL — AB (ref 3.87–5.11)
RDW: 13.5 % (ref 11.5–15.5)
WBC: 7 10*3/uL (ref 4.0–10.5)

## 2016-05-09 LAB — BASIC METABOLIC PANEL
ANION GAP: 4 — AB (ref 5–15)
BUN: 8 mg/dL (ref 6–20)
CALCIUM: 8.6 mg/dL — AB (ref 8.9–10.3)
CHLORIDE: 106 mmol/L (ref 101–111)
CO2: 25 mmol/L (ref 22–32)
CREATININE: 0.59 mg/dL (ref 0.44–1.00)
GFR calc non Af Amer: >60 mL/min (ref >60)
Glucose, Bld: 79 mg/dL (ref 65–99)
Potassium: 3.5 mmol/L (ref 3.5–5.1)
SODIUM: 135 mmol/L (ref 135–145)

## 2016-05-09 NOTE — MAU Note (Signed)
Fell at 1000 yesterday and landed on her abdomen; sent from peri-op for monitoring; denies vaginal bleeding or vaginal leaking; c/o lower, abdominal cramping;

## 2016-05-09 NOTE — MAU Provider Note (Signed)
History     CSN: [id number]  Arrival date and time: 05/09/16 4098   First Provider Initiated Contact with Patient 05/09/16 1043      Chief Complaint  Patient presents with  . Fall   HPI  30 year old G3 P2 at 38 weeks and 5 days who is in preop today for a scheduled C-section due to HSV outbreak and told them she had had a fall yesterday. Her physician was contacted and she was referred here for further evaluation. She reports she fell yesterday afternoon while playing with her daughter on the playground. She did not come to the emergency room because she had to pick her other daughter from the bus. She reports over since she fell she's had some tightening of her abdomen which she thought was just her baby balling up. These sensations do correlate to contractions however. She denies any vaginal bleeding or loss of fluid and does report regular fetal movement  OB History    Gravida Para Term Preterm AB Living   3 2 2  0 0 2   SAB TAB Ectopic Multiple Live Births   0 0 0   2      Past Medical History:  Diagnosis Date  . Asthma   . Gestational diabetes    diet controlled  . HSV (herpes simplex virus) anogenital infection   . Hypertension     Past Surgical History:  Procedure Laterality Date  . WISDOM TOOTH EXTRACTION      Family History  Problem Relation Age of Onset  . Heart disease Mother   . Hypertension Mother   . Diabetes Mother   . Hypertension Sister     Social History  Substance Use Topics  . Smoking status: Never Smoker  . Smokeless tobacco: Never Used  . Alcohol use No       Prescriptions Prior to Admission  Medication Sig Dispense Refill Last Dose  . acetaminophen (TYLENOL) 500 MG tablet Take 500 mg by mouth every 6 (six) hours as needed for mild pain.   05/08/2016 at 11  . Prenatal Vit-Fe Fumarate-FA (PRENATAL MULTIVITAMIN) TABS tablet Take 1 tablet by mouth daily at 12 noon.   05/08/2016 at Unknown time  . valACYclovir (VALTREX) 1000 MG tablet Take  1,000 mg by mouth daily.    05/08/2016 at Unknown time    Review of Systems  Constitutional: Negative for chills and fever.  HENT: Negative for congestion.   Eyes: Negative for blurred vision and double vision.  Respiratory: Negative for cough and shortness of breath.   Cardiovascular: Negative for chest pain and palpitations.  Gastrointestinal: Negative for abdominal pain, heartburn, nausea and vomiting.  Genitourinary: Negative for dysuria, frequency and urgency.  Skin: Negative for itching and rash.  Neurological: Negative for dizziness, tingling, tremors and headaches.  Endo/Heme/Allergies: Negative for environmental allergies. Does not bruise/bleed easily.   Physical Exam   Blood pressure 102/68, pulse 69, temperature 98.4 F (36.9 C), temperature source Oral, resp. rate 16, last menstrual period 08/03/2015.  Physical Exam  Constitutional: She is oriented to person, place, and time. She appears well-developed and well-nourished.  Cardiovascular: Normal rate and intact distal pulses.   Respiratory: Effort normal. No respiratory distress.  GI: Soft. Bowel sounds are normal. There is no tenderness.  Gravid  Neurological: She is alert and oriented to person, place, and time.  Skin: Skin is warm and dry.  Psychiatric: She has a normal mood and affect.    MAU Course  Procedures  MDM In the  MAU patient underwent nonstress testing which initially although showed moderate variability was not reactive, when patient returned from ultrasound had good 15 x 15 accelerations. She did have BPP which did not reveal any placental abruption normal AFI. Patient did have 2 points off for breathing but given the remainder of the testing Dr. Estanislado Pandyivard was okay with discharge home with follow-up NST tomorrow. Patient reported that the tightening she was feeling initially on admission also had significantly improved.  Assessment and Plan  1. Fall. Patient with approximately 2 hours of monitoring in MAU  shows reactive NST with no decelerations. Initially she was having contractions every 3-5 minutes which seemed to be improving. On discharge she was only having occasional contractions. Patient additionally had BPP which was 6 out of 8 with 2 points off for breathing and showed no placental abruption. Patient okay for discharge home and plan for scheduled C-section on Thursday.  Ernestina Pennaicholas Zaylie Gisler 05/09/2016, 10:44 AM

## 2016-05-09 NOTE — MAU Note (Signed)
Scheduled for c-section on Thursday @ 1130;

## 2016-05-09 NOTE — Discharge Instructions (Signed)
Nonstress Test  The nonstress test is a procedure that monitors the fetus's heartbeat. The test will monitor the heartbeat when the fetus is at rest and while the fetus is moving. In a healthy fetus, there will be an increase in fetal heart rate when the fetus moves or kicks. The heart rate will decrease at rest. This test helps determine if the fetus is healthy. Your health care provider will look at a number of patterns in the heart rate tracing to make sure your baby is thriving. If there is concern, your health care provider may order additional tests or may suggest another course of action. This test is often done in the third trimester and can help determine if an early delivery is needed and safe. Common reasons to have this test are:  · You are past your due date.  · You have a high-risk pregnancy.  · You are feeling less movement than normal.  · You have lost a pregnancy in the past.  · Your health care provider suspects fetal growth problems.  · You have too much or too little amniotic fluid.  BEFORE THE PROCEDURE  · Eat a meal right before the test or as directed by your health care provider. Food may help stimulate fetal movements.  · Use the restroom right before the test.  PROCEDURE  · Two belts will be placed around your abdomen. These belts have monitors attached to them. One records the fetal heart rate and the other records uterine contractions.  · You may be asked to lie down on your side or to stay sitting upright.  · You may be given a button to press when you feel movement.  · The fetal heartbeat is listened to and watched on a screen. The heartbeat is recorded on a sheet of paper.  · If the fetus seems to be sleeping, you may be asked to drink some juice or soda, gently press your abdomen, or make some noise to wake the fetus.  AFTER THE PROCEDURE   Your health care provider will discuss the test results with you and make recommendations for the near future.     This information is not  intended to replace advice given to you by your health care provider. Make sure you discuss any questions you have with your health care provider.     Document Released: 08/04/2002 Document Revised: 09/04/2014 Document Reviewed: 09/17/2012  Elsevier Interactive Patient Education ©2016 [employer]

## 2016-05-09 NOTE — Patient Instructions (Signed)
20 [name]  05/09/2016   Your procedure is scheduled on:  05/11/2016  Enter through the Main Entrance of [facility] at 1000 AM.  Pick up the phone at the desk and dial [phone].   Call this number if you have problems the morning of surgery: [phone]   Remember:   Do not eat food:After Midnight.  Do not drink clear liquids: After Midnight.  Take these medicines the morning of surgery with A SIP OF WATER: valtrex   Do not wear jewelry, make-up or nail polish.  Do not wear lotions, powders, or perfumes. Do not wear deodorant.  Do not shave 48 hours prior to surgery.  Do not bring valuables to the hospital.  [facility] is not   responsible for any belongings or valuables brought to the hospital.  Contacts, dentures or bridgework may not be worn into surgery.  Leave suitcase in the car. After surgery it may be brought to your room.  For patients admitted to the hospital, checkout time is 11:00 AM the day of              discharge.   Patients discharged the day of surgery will not be allowed to drive             home.  Name and phone number of your driver: na Special Instructions:   N/A   Please read over the following fact sheets that you were given:   Surgical Site Infection Prevention

## 2016-05-10 ENCOUNTER — Encounter (HOSPITAL_COMMUNITY): Payer: Self-pay | Admitting: Anesthesiology

## 2016-05-10 LAB — RPR: RPR Ser Ql: NONREACTIVE

## 2016-05-10 MED ORDER — GENTAMICIN SULFATE 40 MG/ML IJ SOLN
INTRAVENOUS | Status: AC
  Administered 2016-05-11: 115.25 mL via INTRAVENOUS
  Filled 2016-05-10: qty 9.25

## 2016-05-10 NOTE — H&P (Signed)
Admission History and Physical Exam for an Obstetrics Patient  [name] is a 30 y.o. female, G3P2002, at [redacted] weeks gestation, who presents for cesarean section and tubal sterilization procedure. She has been followed at the [facility] of [facility].  Her pregnancy has been complicated by:  Recent herpes outbreak  Desires sterilization  Blood type O-  Penicillin and amoxicillin allergy  Asthma.   See history below.  The patient was diagnosed with a herpes outbreak on April 20, 2016.  She was appropriately treated.  Her lesion resolved on April 22, 2016.  Nevertheless, the patient does not want to take a chance of a problem for her baby.  She wants to proceed with cesarean delivery and tubal sterilization.  OB History    Gravida Para Term Preterm AB Living   3 2 2  0 0 2   SAB TAB Ectopic Multiple Live Births   0 0 0   2      Past Medical History:  Diagnosis Date  . Asthma   . Gestational diabetes    diet controlled  . HSV (herpes simplex virus) anogenital infection   . Hypertension     No prescriptions prior to admission.    Past Surgical History:  Procedure Laterality Date  . WISDOM TOOTH EXTRACTION      Drug allergies: Penicillin and amoxicillin cause a rash, hives, and a significant reaction.  Family History: family history includes Diabetes in her mother; Heart disease in her mother; Hypertension in her mother and sister.  Social History:  reports that she has never smoked. She has never used smokeless tobacco. She reports that she does not drink alcohol or use drugs.  Review of systems: Normal pregnancy complaints.  Admission Physical Exam:    Body mass index is 30.41 kg/m.  Height 5\' 8"  (1.727 m), weight 200 lb (90.7 kg), last menstrual period 08/03/2015.  HEENT:                 Within normal limits Chest:                   Clear Heart:                    Regular rate and  rhythm Abdomen:             Gravid and nontender Extremities:          Grossly normal Neurologic exam: Grossly normal Pelvic exam:         Cervix: closed and long  Prenatal labs: ABO, Rh:             --/--/O NEG (09/12 0940) HBsAg:                 Negative (03/22 0000)  HIV:                       Non-reactive (03/22 0000)  GBS:                     negative Antibody:              POS (09/12 0940) Rubella:                immune RPR:                    Non Reactive (09/12 0940)      Prenatal Transfer Tool  Maternal Diabetes: No Genetic Screening: Normal Maternal Ultrasounds/Referrals: Normal Fetal Ultrasounds or other Referrals:  None Maternal Substance Abuse:  No Significant Maternal Medications:  None Significant Maternal Lab Results:  Lab values include: Other:  Other Comments:  positive herpes test on April 20, 2016.  Appropriately treated.  Assessment:  [redacted] weeks gestation  Desire sterilization  Positive herpes test  Blood type O-  Asthma  Penicillin and amoxicillin allergy  Plan:  The patient will undergo a primary cesarean section and tubal sterilization procedure.  She understands the indications for surgical procedure as well as the alternative treatment options.  She accepts the risk of, but not limited to, anesthetic complications, bleeding, infections, possible damage to the surrounding organs, and possible tubal failure.  We will treat the patient with gentamicin and clindamycin because of her significant penicillin allergy.   Janine LimboSTRINGER,Admire Bunnell V 05/10/2016, 7:58 PM

## 2016-05-10 NOTE — Anesthesia Preprocedure Evaluation (Addendum)
Anesthesia Evaluation  Patient identified by MRN, date of birth, ID band Patient awake    Reviewed: Allergy & Precautions, NPO status , Patient's Chart, lab work & pertinent test results  Airway Mallampati: III  TM Distance: >3 FB Neck ROM: Full    Dental no notable dental hx. (+) Teeth Intact   Pulmonary asthma ,    Pulmonary exam normal breath sounds clear to auscultation       Cardiovascular hypertension, Normal cardiovascular exam Rhythm:Regular Rate:Normal     Neuro/Psych negative neurological ROS  negative psych ROS   GI/Hepatic negative GI ROS, Neg liver ROS,   Endo/Other  diabetes, Well Controlled, GestationalObesity  Renal/GU negative Renal ROS  negative genitourinary   Musculoskeletal negative musculoskeletal ROS (+)   Abdominal (+) + obese,   Peds  Hematology  (+) anemia ,   Anesthesia Other Findings   Reproductive/Obstetrics HSV-anogenital Desires sterilization                            Lab Results  Component Value Date   WBC 7.0 05/09/2016   HGB 11.6 (L) 05/09/2016   HCT 33.2 (L) 05/09/2016   MCV 92.0 05/09/2016   PLT 210 05/09/2016     Chemistry      Component Value Date/Time   NA 135 05/09/2016 0940   K 3.5 05/09/2016 0940   CL 106 05/09/2016 0940   CO2 25 05/09/2016 0940   BUN 8 05/09/2016 0940   CREATININE 0.59 05/09/2016 0940      Component Value Date/Time   CALCIUM 8.6 (L) 05/09/2016 0940   ALKPHOS 57 09/09/2015 1931   AST 16 09/09/2015 1931   ALT 12 (L) 09/09/2015 1931   BILITOT 0.8 09/09/2015 1931      Anesthesia Physical Anesthesia Plan  ASA: II  Anesthesia Plan: Spinal   Post-op Pain Management:    Induction:   Airway Management Planned: Natural Airway and Nasal Cannula  Additional Equipment:   Intra-op Plan:   Post-operative Plan:   Informed Consent: I have reviewed the patients History and Physical, chart, labs and discussed the  procedure including the risks, benefits and alternatives for the proposed anesthesia with the patient or authorized representative who has indicated his/her understanding and acceptance.   Dental advisory given  Plan Discussed with: Anesthesiologist, CRNA and Surgeon  Anesthesia Plan Comments:         Anesthesia Quick Evaluation

## 2016-05-11 ENCOUNTER — Encounter (HOSPITAL_COMMUNITY): Payer: Self-pay

## 2016-05-11 ENCOUNTER — Inpatient Hospital Stay (HOSPITAL_COMMUNITY): Payer: Medicaid Other | Admitting: Anesthesiology

## 2016-05-11 ENCOUNTER — Encounter (HOSPITAL_COMMUNITY)
Admission: RE | Disposition: A | Discharge status: Home / Self Care | Payer: Self-pay | Source: Ambulatory Visit | Attending: Obstetrics and Gynecology

## 2016-05-11 ENCOUNTER — Inpatient Hospital Stay (HOSPITAL_COMMUNITY)
Admission: RE | Admit: 2016-05-11 | Discharge: 2016-05-13 | DRG: 766 | Disposition: A | Discharge status: Home / Self Care | Payer: Medicaid Other | Source: Ambulatory Visit | Attending: Obstetrics and Gynecology | Admitting: Obstetrics and Gynecology

## 2016-05-11 DIAGNOSIS — O99214 Obesity complicating childbirth: Secondary | ICD-10-CM | POA: Diagnosis present

## 2016-05-11 DIAGNOSIS — J45909 Unspecified asthma, uncomplicated: Secondary | ICD-10-CM | POA: Diagnosis present

## 2016-05-11 DIAGNOSIS — Z6830 Body mass index (BMI) 30.0-30.9, adult: Secondary | ICD-10-CM

## 2016-05-11 DIAGNOSIS — Z3A39 39 weeks gestation of pregnancy: Secondary | ICD-10-CM | POA: Diagnosis not present

## 2016-05-11 DIAGNOSIS — Z880 Allergy status to penicillin: Secondary | ICD-10-CM

## 2016-05-11 DIAGNOSIS — Z302 Encounter for sterilization: Secondary | ICD-10-CM

## 2016-05-11 DIAGNOSIS — A6000 Herpesviral infection of urogenital system, unspecified: Secondary | ICD-10-CM | POA: Diagnosis present

## 2016-05-11 DIAGNOSIS — O9832 Other infections with a predominantly sexual mode of transmission complicating childbirth: Secondary | ICD-10-CM | POA: Diagnosis present

## 2016-05-11 DIAGNOSIS — O26893 Other specified pregnancy related conditions, third trimester: Secondary | ICD-10-CM | POA: Diagnosis present

## 2016-05-11 DIAGNOSIS — Z6791 Unspecified blood type, Rh negative: Secondary | ICD-10-CM

## 2016-05-11 DIAGNOSIS — E669 Obesity, unspecified: Secondary | ICD-10-CM | POA: Diagnosis present

## 2016-05-11 DIAGNOSIS — Z8249 Family history of ischemic heart disease and other diseases of the circulatory system: Secondary | ICD-10-CM

## 2016-05-11 DIAGNOSIS — O9952 Diseases of the respiratory system complicating childbirth: Secondary | ICD-10-CM | POA: Diagnosis present

## 2016-05-11 DIAGNOSIS — Z833 Family history of diabetes mellitus: Secondary | ICD-10-CM | POA: Diagnosis not present

## 2016-05-11 DIAGNOSIS — B009 Herpesviral infection, unspecified: Secondary | ICD-10-CM | POA: Diagnosis present

## 2016-05-11 LAB — GLUCOSE, CAPILLARY: GLUCOSE-CAPILLARY: 70 mg/dL (ref 65–99)

## 2016-05-11 SURGERY — Surgical Case
Anesthesia: Spinal | Laterality: Bilateral

## 2016-05-11 MED ORDER — BUPIVACAINE-EPINEPHRINE 0.5% -1:200000 IJ SOLN
INTRAMUSCULAR | Status: DC | PRN
  Administered 2016-05-11: 10 mL

## 2016-05-11 MED ORDER — SIMETHICONE 80 MG PO CHEW
80.0000 mg | CHEWABLE_TABLET | Freq: Three times a day (TID) | ORAL | Status: DC
  Administered 2016-05-11 – 2016-05-13 (×5): 80 mg via ORAL
  Filled 2016-05-11 (×4): qty 1

## 2016-05-11 MED ORDER — MORPHINE SULFATE-NACL 0.5-0.9 MG/ML-% IV SOSY
PREFILLED_SYRINGE | INTRAVENOUS | Status: AC
  Filled 2016-05-11: qty 1

## 2016-05-11 MED ORDER — MEPERIDINE HCL 25 MG/ML IJ SOLN: 6.2500 mg | INTRAMUSCULAR | Status: DC | PRN

## 2016-05-11 MED ORDER — WITCH HAZEL-GLYCERIN EX PADS: 1.0000 "application " | MEDICATED_PAD | CUTANEOUS | Status: DC | PRN

## 2016-05-11 MED ORDER — SODIUM CHLORIDE 0.9% FLUSH: 3.0000 mL | INTRAVENOUS | Status: DC | PRN

## 2016-05-11 MED ORDER — ONDANSETRON HCL 4 MG/2ML IJ SOLN: 4.0000 mg | Freq: Three times a day (TID) | INTRAMUSCULAR | Status: DC | PRN

## 2016-05-11 MED ORDER — LACTATED RINGERS IV SOLN
INTRAVENOUS | Status: DC
  Administered 2016-05-11 (×3): via INTRAVENOUS

## 2016-05-11 MED ORDER — ACETAMINOPHEN 500 MG PO TABS
1000.0000 mg | ORAL_TABLET | Freq: Four times a day (QID) | ORAL | Status: AC
  Administered 2016-05-11 – 2016-05-12 (×3): 1000 mg via ORAL
  Filled 2016-05-11 (×3): qty 2

## 2016-05-11 MED ORDER — PRENATAL MULTIVITAMIN CH
1.0000 | ORAL_TABLET | Freq: Every day | ORAL | Status: DC
  Administered 2016-05-13: 1 via ORAL
  Filled 2016-05-11 (×2): qty 1

## 2016-05-11 MED ORDER — SIMETHICONE 80 MG PO CHEW
80.0000 mg | CHEWABLE_TABLET | ORAL | Status: DC
  Administered 2016-05-12: 80 mg via ORAL
  Filled 2016-05-11 (×2): qty 1

## 2016-05-11 MED ORDER — IBUPROFEN 600 MG PO TABS: 600.0000 mg | ORAL_TABLET | Freq: Four times a day (QID) | ORAL | Status: DC | PRN

## 2016-05-11 MED ORDER — OXYTOCIN 10 UNIT/ML IJ SOLN
INTRAMUSCULAR | Status: AC
  Filled 2016-05-11: qty 4

## 2016-05-11 MED ORDER — FENTANYL CITRATE (PF) 100 MCG/2ML IJ SOLN: 25.0000 ug | INTRAMUSCULAR | Status: DC | PRN

## 2016-05-11 MED ORDER — MENTHOL 3 MG MT LOZG: 1.0000 | LOZENGE | OROMUCOSAL | Status: DC | PRN

## 2016-05-11 MED ORDER — BUPIVACAINE-EPINEPHRINE (PF) 0.5% -1:200000 IJ SOLN
INTRAMUSCULAR | Status: AC
  Filled 2016-05-11: qty 30

## 2016-05-11 MED ORDER — NALBUPHINE HCL 10 MG/ML IJ SOLN: 5.0000 mg | INTRAMUSCULAR | Status: DC | PRN

## 2016-05-11 MED ORDER — SENNOSIDES-DOCUSATE SODIUM 8.6-50 MG PO TABS
2.0000 | ORAL_TABLET | ORAL | Status: DC
  Administered 2016-05-12 (×2): 2 via ORAL
  Filled 2016-05-11 (×2): qty 2

## 2016-05-11 MED ORDER — DIBUCAINE 1 % RE OINT: 1.0000 "application " | TOPICAL_OINTMENT | RECTAL | Status: DC | PRN

## 2016-05-11 MED ORDER — NALOXONE HCL 0.4 MG/ML IJ SOLN: 0.4000 mg | INTRAMUSCULAR | Status: DC | PRN

## 2016-05-11 MED ORDER — PHENYLEPHRINE 8 MG IN D5W 100 ML (0.08MG/ML) PREMIX OPTIME
INJECTION | INTRAVENOUS | Status: DC | PRN
  Administered 2016-05-11: 60 ug/min via INTRAVENOUS

## 2016-05-11 MED ORDER — MEASLES, MUMPS & RUBELLA VAC ~~LOC~~ INJ
0.5000 mL | INJECTION | Freq: Once | SUBCUTANEOUS | Status: DC
  Filled 2016-05-11: qty 0.5

## 2016-05-11 MED ORDER — TETANUS-DIPHTH-ACELL PERTUSSIS 5-2.5-18.5 LF-MCG/0.5 IM SUSP: 0.5000 mL | Freq: Once | INTRAMUSCULAR | Status: DC

## 2016-05-11 MED ORDER — KETOROLAC TROMETHAMINE 30 MG/ML IJ SOLN: 30.0000 mg | Freq: Four times a day (QID) | INTRAMUSCULAR | Status: AC | PRN

## 2016-05-11 MED ORDER — BUPIVACAINE IN DEXTROSE 0.75-8.25 % IT SOLN
INTRATHECAL | Status: DC | PRN
  Administered 2016-05-11: 1.8 mL via INTRATHECAL

## 2016-05-11 MED ORDER — ONDANSETRON HCL 4 MG/2ML IJ SOLN
INTRAMUSCULAR | Status: DC | PRN
  Administered 2016-05-11: 4 mg via INTRAVENOUS

## 2016-05-11 MED ORDER — FENTANYL CITRATE (PF) 100 MCG/2ML IJ SOLN
INTRAMUSCULAR | Status: DC | PRN
  Administered 2016-05-11: 20 ug via INTRATHECAL

## 2016-05-11 MED ORDER — FENTANYL CITRATE (PF) 100 MCG/2ML IJ SOLN
INTRAMUSCULAR | Status: AC
  Filled 2016-05-11: qty 2

## 2016-05-11 MED ORDER — KETOROLAC TROMETHAMINE 30 MG/ML IJ SOLN
INTRAMUSCULAR | Status: AC
  Filled 2016-05-11: qty 1

## 2016-05-11 MED ORDER — MORPHINE SULFATE (PF) 0.5 MG/ML IJ SOLN
INTRAMUSCULAR | Status: DC | PRN
  Administered 2016-05-11: .2 mg via INTRATHECAL

## 2016-05-11 MED ORDER — LACTATED RINGERS IV SOLN
INTRAVENOUS | Status: DC | PRN
Start: 2016-05-11 — End: 2016-05-11
  Administered 2016-05-11: 13:00:00 via INTRAVENOUS

## 2016-05-11 MED ORDER — PHENYLEPHRINE 8 MG IN D5W 100 ML (0.08MG/ML) PREMIX OPTIME
INJECTION | INTRAVENOUS | Status: AC
  Filled 2016-05-11: qty 100

## 2016-05-11 MED ORDER — DIPHENHYDRAMINE HCL 50 MG/ML IJ SOLN
12.5000 mg | INTRAMUSCULAR | Status: DC | PRN
  Administered 2016-05-11: 12.5 mg via INTRAVENOUS
  Filled 2016-05-11: qty 1

## 2016-05-11 MED ORDER — NALBUPHINE HCL 10 MG/ML IJ SOLN: 5.0000 mg | Freq: Once | INTRAMUSCULAR | Status: AC | PRN

## 2016-05-11 MED ORDER — ZOLPIDEM TARTRATE 5 MG PO TABS: 5.0000 mg | ORAL_TABLET | Freq: Every evening | ORAL | Status: DC | PRN

## 2016-05-11 MED ORDER — OXYTOCIN 40 UNITS IN LACTATED RINGERS INFUSION - SIMPLE MED: 2.5000 [IU]/h | INTRAVENOUS | Status: AC

## 2016-05-11 MED ORDER — SIMETHICONE 80 MG PO CHEW: 80.0000 mg | CHEWABLE_TABLET | ORAL | Status: DC | PRN

## 2016-05-11 MED ORDER — ONDANSETRON HCL 4 MG/2ML IJ SOLN
INTRAMUSCULAR | Status: AC
  Filled 2016-05-11: qty 2

## 2016-05-11 MED ORDER — NALBUPHINE HCL 10 MG/ML IJ SOLN
5.0000 mg | INTRAMUSCULAR | Status: DC | PRN
  Administered 2016-05-11: 5 mg via INTRAVENOUS
  Filled 2016-05-11: qty 1

## 2016-05-11 MED ORDER — LACTATED RINGERS IR SOLN
Status: DC | PRN
  Administered 2016-05-11: 1000 mL

## 2016-05-11 MED ORDER — HYDROMORPHONE HCL 2 MG PO TABS
2.0000 mg | ORAL_TABLET | ORAL | Status: DC | PRN
  Administered 2016-05-12 – 2016-05-13 (×4): 2 mg via ORAL
  Filled 2016-05-11 (×4): qty 1

## 2016-05-11 MED ORDER — OXYTOCIN 10 UNIT/ML IJ SOLN
INTRAVENOUS | Status: DC | PRN
  Administered 2016-05-11: 40 [IU] via INTRAVENOUS

## 2016-05-11 MED ORDER — NALBUPHINE HCL 10 MG/ML IJ SOLN
INTRAMUSCULAR | Status: AC
  Filled 2016-05-11: qty 1

## 2016-05-11 MED ORDER — IBUPROFEN 600 MG PO TABS
600.0000 mg | ORAL_TABLET | Freq: Four times a day (QID) | ORAL | Status: DC
  Administered 2016-05-11 – 2016-05-13 (×7): 600 mg via ORAL
  Filled 2016-05-11 (×7): qty 1

## 2016-05-11 MED ORDER — DIPHENHYDRAMINE HCL 25 MG PO CAPS: 25.0000 mg | ORAL_CAPSULE | Freq: Four times a day (QID) | ORAL | Status: DC | PRN

## 2016-05-11 MED ORDER — COCONUT OIL OIL: 1.0000 "application " | TOPICAL_OIL | Status: DC | PRN

## 2016-05-11 MED ORDER — ACETAMINOPHEN 325 MG PO TABS
650.0000 mg | ORAL_TABLET | ORAL | Status: DC | PRN
  Administered 2016-05-13: 650 mg via ORAL
  Filled 2016-05-11: qty 2

## 2016-05-11 MED ORDER — NALBUPHINE HCL 10 MG/ML IJ SOLN
5.0000 mg | Freq: Once | INTRAMUSCULAR | Status: AC | PRN
  Administered 2016-05-11: 5 mg via SUBCUTANEOUS

## 2016-05-11 MED ORDER — LACTATED RINGERS IV SOLN
INTRAVENOUS | Status: DC
  Administered 2016-05-12: via INTRAVENOUS

## 2016-05-11 MED ORDER — DIPHENHYDRAMINE HCL 25 MG PO CAPS
25.0000 mg | ORAL_CAPSULE | ORAL | Status: DC | PRN
  Administered 2016-05-12: 25 mg via ORAL
  Filled 2016-05-11 (×2): qty 1

## 2016-05-11 MED ORDER — MEDROXYPROGESTERONE ACETATE 150 MG/ML IM SUSP: 150.0000 mg | INTRAMUSCULAR | Status: DC | PRN

## 2016-05-11 MED ORDER — KETOROLAC TROMETHAMINE 30 MG/ML IJ SOLN
30.0000 mg | Freq: Four times a day (QID) | INTRAMUSCULAR | Status: AC | PRN
  Administered 2016-05-11: 30 mg via INTRAMUSCULAR

## 2016-05-11 MED ORDER — INFLUENZA VAC SPLIT QUAD 0.5 ML IM SUSY: 0.5000 mL | PREFILLED_SYRINGE | INTRAMUSCULAR | Status: DC

## 2016-05-11 MED ORDER — SCOPOLAMINE 1 MG/3DAYS TD PT72
MEDICATED_PATCH | TRANSDERMAL | Status: AC
  Administered 2016-05-11: 1.5 mg via TRANSDERMAL
  Filled 2016-05-11: qty 1

## 2016-05-11 MED ORDER — SCOPOLAMINE 1 MG/3DAYS TD PT72
1.0000 | MEDICATED_PATCH | Freq: Once | TRANSDERMAL | Status: DC
  Administered 2016-05-11: 1.5 mg via TRANSDERMAL

## 2016-05-11 MED ORDER — DEXTROSE 5 % IV SOLN
1.0000 ug/kg/h | INTRAVENOUS | Status: DC | PRN
  Filled 2016-05-11: qty 2

## 2016-05-11 SURGICAL SUPPLY — 37 items
CHLORAPREP W/TINT 26ML (MISCELLANEOUS) ×3 IMPLANT
CLAMP CORD UMBIL (MISCELLANEOUS) IMPLANT
CLOTH BEACON ORANGE TIMEOUT ST (SAFETY) ×3 IMPLANT
CONTAINER PREFILL 10% NBF 15ML (MISCELLANEOUS) IMPLANT
DRAIN JACKSON PRT FLT 7MM (DRAIN) IMPLANT
DRSG OPSITE POSTOP 4X10 (GAUZE/BANDAGES/DRESSINGS) ×3 IMPLANT
ELECT REM PT RETURN 9FT ADLT (ELECTROSURGICAL) ×3
ELECTRODE REM PT RTRN 9FT ADLT (ELECTROSURGICAL) ×1 IMPLANT
EVACUATOR SILICONE 100CC (DRAIN) IMPLANT
EXTRACTOR VACUUM M CUP 4 TUBE (SUCTIONS) IMPLANT
EXTRACTOR VACUUM M CUP 4' TUBE (SUCTIONS)
GLOVE BIOGEL PI IND STRL 7.0 (GLOVE) ×2 IMPLANT
GLOVE BIOGEL PI IND STRL 8.5 (GLOVE) ×1 IMPLANT
GLOVE BIOGEL PI INDICATOR 7.0 (GLOVE) ×4
GLOVE BIOGEL PI INDICATOR 8.5 (GLOVE) ×2
GLOVE ECLIPSE 8.0 STRL XLNG CF (GLOVE) ×6 IMPLANT
GOWN STRL REUS W/TWL LRG LVL3 (GOWN DISPOSABLE) ×9 IMPLANT
KIT ABG SYR 3ML LUER SLIP (SYRINGE) IMPLANT
NEEDLE HYPO 22GX1.5 SAFETY (NEEDLE) ×3 IMPLANT
NEEDLE HYPO 25X5/8 SAFETYGLIDE (NEEDLE) IMPLANT
NS IRRIG 1000ML POUR BTL (IV SOLUTION) ×3 IMPLANT
PACK C SECTION WH (CUSTOM PROCEDURE TRAY) ×3 IMPLANT
PAD OB MATERNITY 4.3X12.25 (PERSONAL CARE ITEMS) ×3 IMPLANT
PENCIL SMOKE EVAC W/HOLSTER (ELECTROSURGICAL) ×3 IMPLANT
SUT MNCRL AB 3-0 PS2 27 (SUTURE) ×3 IMPLANT
SUT PLAIN 0 NONE (SUTURE) IMPLANT
SUT SILK 3 0 FS 1X18 (SUTURE) IMPLANT
SUT VIC AB 0 CT1 27 (SUTURE) ×4
SUT VIC AB 0 CT1 27XBRD ANBCTR (SUTURE) ×2 IMPLANT
SUT VIC AB 2-0 CTX 36 (SUTURE) ×6 IMPLANT
SUT VIC AB 3-0 CT1 27 (SUTURE)
SUT VIC AB 3-0 CT1 TAPERPNT 27 (SUTURE) IMPLANT
SUT VIC AB 3-0 SH 27 (SUTURE)
SUT VIC AB 3-0 SH 27X BRD (SUTURE) IMPLANT
SYR CONTROL 10ML LL (SYRINGE) ×3 IMPLANT
TOWEL OR 17X24 6PK STRL BLUE (TOWEL DISPOSABLE) ×3 IMPLANT
TRAY FOLEY CATH SILVER 14FR (SET/KITS/TRAYS/PACK) ×3 IMPLANT

## 2016-05-11 NOTE — Op Note (Signed)
OPERATIVE NOTE  Patient's Name: Katherine Serrano  Date of Birth: [birth year]  Medical Records Number: [id number]  Date of Operation: 05/11/2016  Preoperative diagnosis:  8713w0d weeks gestation  Herpes Simplex Viral Infection and desire for sterilization  Postoperative diagnosis:  5313w0d weeks gestation  Herpes Simplex Viral Infection and desire for sterilization  Procedure:  Primary low transverse cesarean section  Bilateral tubal sterilization procedure  Surgeon:  Leonard SchwartzArthur Vernon Laylonie Marzec, M.D.  Assistant:  Operating room staff  Anesthesia:  Regional  Disposition:  Katherine Serrano is a 30 y.o. female, G3P3003, who presents at 2813w0d weeks gestation. The patient has been [facility]. She has the above mentioned diagnosis. She understands the indications for her procedure and she accepts the risk of, but not limited to, anesthetic complications, bleeding, infections, and possible damage to the surrounding organs.  Findings:  A  healthy female Lyn Hollingshead(Alexander) was delivered from a occiput anterior position.  The Apgar scores were 8/9 . The uterus, fallopian tubes, and ovaries were normal for the gravid state.  Procedure:  The patient was taken to the operating room where a spinal anesthetic was given. The patient's abdomen was prepped with Chloraprep. The perineum was prepped with betadine. A Foley catheter was placed in the bladder. The patient was sterilely draped. A "timeout" was performed which properly identified the patient and the correct operative procedure. The lower abdomen was injected with half percent Marcaine with epinephrine. A low transverse incision was made in the abdomen and carried sharply through the subcutaneous tissue, the fascia, and the anterior peritoneum. An incision was made in the lower uterine segment. The incision was extended in a low transverse fashion. The  membranes were ruptured. The fetal head was delivered without difficulty. The mouth and nose were suctioned. The remainder of the infant was then delivered. The cord was clamped and cut. The infant was handed to the awaiting pediatric team. The placenta was removed. The uterine cavity was cleaned of amniotic fluid, clotted blood, and membranes. The uterine incision was closed using a running locking suture of 2-0 Vicryl. An imbricating suture of 2-0 Vicryl was placed. The pelvis was vigorously irrigated. Hemostasis was adequate. The left fallopian tube was identified and followed to its fimbriated end. A knuckle of tube was made on the left using 2 free ties of 0 plain catgut. The knuckle of tube was then excised. Hemostasis was confirmed. An identical procedure was carried out on the opposite side. Irrigation was accomplished. The anterior peritoneum and the abdominal musculature were closed using 2-0 Vicryl. The fascia was closed using a running suture of 0 Vicryl followed by 3 interrupted sutures of 0 Vicryl. The subcutaneous layer was closed using interrupted sutures of 2-0 Vicryl. The skin was reapproximated using a subcuticular suture of 3-0 Monocryl. Sponge, needle, and instrument counts were correct on 2 occasions. The estimated blood loss for the procedure was 800 cc. The patient tolerated her procedure well. She was transported to the recovery room in stable condition. The infant remained with the mother for bonding and was noted to be in stable condition. The placenta was sent to labor and delivery. The portions of the fallopian tubes were sent to pathology  Leonard SchwartzArthur Vernon Nissa Stannard, M.D. 05/11/2016 1:15 PM

## 2016-05-11 NOTE — Progress Notes (Signed)
The patient was interviewed and examined today.  The previously documented history and physical examination was reviewed. There are no changes. The operative procedure was reviewed. The risks and benefits were outlined again. The specific risks include, but are not limited to, anesthetic complications, bleeding, infections, and possible damage to the surrounding organs. The patient's questions were answered.  We are ready to proceed as outlined. The likelihood of the patient achieving the goals of this procedure is very likely.   Miyo Aina Vernon Allean Montfort, M.D.  

## 2016-05-11 NOTE — Anesthesia Procedure Notes (Signed)
Spinal  Patient location during procedure: OR Start time: 05/11/2016 12:06 PM Staffing Anesthesiologist: Mal AmabileFOSTER, Kristan Votta Performed: anesthesiologist  Preanesthetic Checklist Completed: patient identified, site marked, surgical consent, pre-op evaluation, timeout performed, IV checked, risks and benefits discussed and monitors and equipment checked Spinal Block Patient position: sitting Prep: site prepped and draped and DuraPrep Patient monitoring: heart rate, cardiac monitor, continuous pulse ox and blood pressure Approach: midline Location: L3-4 Injection technique: single-shot Needle Needle type: Sprotte  Needle gauge: 24 G Needle length: 9 cm Needle insertion depth: 6 cm Assessment Sensory level: T4 Additional Notes Patient tolerated procedure well. Adequate sensory level.

## 2016-05-11 NOTE — Anesthesia Postprocedure Evaluation (Signed)
Anesthesia Post Note  Patient: Katherine Serrano  Procedure(s) Performed: Procedure(s) (LRB): CESAREAN SECTION WITH BILATERAL TUBAL LIGATION (Bilateral)  Patient location during evaluation: Mother Baby Anesthesia Type: Spinal Level of consciousness: awake and alert and oriented Pain management: satisfactory to patient Vital Signs Assessment: post-procedure vital signs reviewed and stable Respiratory status: respiratory function stable and spontaneous breathing Cardiovascular status: blood pressure returned to baseline Postop Assessment: no headache, no backache, spinal receding, patient able to bend at knees and adequate PO intake Anesthetic complications: no     Last Vitals:  Vitals:   05/11/16 1704 05/11/16 1800  BP: 98/60 123/65  Pulse: 63 72  Resp: 18 18  Temp: 36.5 C 36.6 C    Last Pain:  Vitals:   05/11/16 1800  TempSrc: Oral  PainSc: 0-No pain   Pain Goal: Patients Stated Pain Goal: 3 (05/11/16 1020)               Dorien Bessent

## 2016-05-11 NOTE — Lactation Note (Signed)
This note was copied from a baby's chart. Lactation Consultation Note  Patient Name: Katherine Renda RollsLatonia Ishii ZHYQM'VToday's Date: 05/11/2016 Reason for consult: Initial assessment   Initial consult with Exp BF mom of >1 hour old infant in PACU. Mom reports she plans to BF this infant. Mom reports infant has fed since birth, he is currently STS with mom. Mom is drowsy, but interacting with LC and infant.   Mom with small breasts and nipples are noted to be divoted in center. Colostrum was expressible from both breasts. Assisted mom in latching infant to right breast. Infant would hold nipple in mouth and did not suckle. Attempted for 20 minutes and then infant left STS with mom.   Enc mom to BF infant 8-12 x in 24 hours at first feeding cues, enc mom to call out to desk for assistance as needed. Discussed using pillows to support infant throughout feeding.   BF Resources Handout and LC Brochure given, mom informed of IP/OP Services, BF Support Groups and LC phone #.    Maternal Data Formula Feeding for Exclusion: No Has patient been taught Hand Expression?: No Does the patient have breastfeeding experience prior to this delivery?: Yes  Feeding Feeding Type: Breast Fed  LATCH Score/Interventions Latch: Repeated attempts needed to sustain latch, nipple held in mouth throughout feeding, stimulation needed to elicit sucking reflex. Intervention(s): Adjust position;Assist with latch;Breast massage;Breast compression  Audible Swallowing: None  Type of Nipple: Everted at rest and after stimulation (Nipples divoted in center)  Comfort (Breast/Nipple): Soft / non-tender     Hold (Positioning): Assistance needed to correctly position infant at breast and maintain latch. Intervention(s): Breastfeeding basics reviewed;Support Pillows;Skin to skin  LATCH Score: 6  Lactation Tools Discussed/Used WIC Program: Yes   Consult Status Consult Status: Follow-up Date: 05/12/16 Follow-up type:  In-patient    Silas FloodSharon S Samiel Peel 05/11/2016, 2:15 PM

## 2016-05-11 NOTE — Transfer of Care (Signed)
Immediate Anesthesia Transfer of Care Note  Patient: Katherine RollsLatonia Mountz  Procedure(s) Performed: Procedure(s): CESAREAN SECTION WITH BILATERAL TUBAL LIGATION (Bilateral)  Patient Location: PACU  Anesthesia Type:Spinal  Level of Consciousness: awake, alert  and oriented  Airway & Oxygen Therapy: Patient Spontanous Breathing  Post-op Assessment: Report given to RN and Post -op Vital signs reviewed and stable  Post vital signs: Reviewed and stable  Last Vitals:  Vitals:   05/11/16 1020  BP: 113/86  Pulse: 70  Resp: 18  Temp: 36.8 C    Last Pain:  Vitals:   05/11/16 1020  TempSrc: Oral      Patients Stated Pain Goal: 3 (05/11/16 1020)  Complications: No apparent anesthesia complications

## 2016-05-11 NOTE — Progress Notes (Signed)
Requested that foley come out now. Out put good and ambulating without any difficulty. Educated that if no void in 6 hours that a In and out will be done, verbalized understanding.

## 2016-05-11 NOTE — Addendum Note (Signed)
Addendum  created 05/11/16 2032 by Graciela HusbandsWynn O Jorgen Wolfinger, CRNA   Sign clinical note

## 2016-05-11 NOTE — Anesthesia Postprocedure Evaluation (Signed)
Anesthesia Post Note  Patient: Katherine Serrano  Procedure(s) Performed: Procedure(s) (LRB): CESAREAN SECTION WITH BILATERAL TUBAL LIGATION (Bilateral)  Patient location during evaluation: PACU Anesthesia Type: Spinal Level of consciousness: awake and alert and oriented Pain management: pain level controlled Vital Signs Assessment: post-procedure vital signs reviewed and stable Respiratory status: spontaneous breathing, nonlabored ventilation and respiratory function stable Cardiovascular status: blood pressure returned to baseline and stable Postop Assessment: no headache, no backache, spinal receding, patient able to bend at knees and no signs of nausea or vomiting Anesthetic complications: no     Last Vitals:  Vitals:   05/11/16 1325 05/11/16 1330  BP:  99/67  Pulse: (!) 57 60  Resp: 17 (!) 7  Temp:      Last Pain:  Vitals:   05/11/16 1355  TempSrc:   PainSc: 1    Pain Goal: Patients Stated Pain Goal: 3 (05/11/16 1020)               [name] A.

## 2016-05-12 LAB — CBC
HCT: 31.1 % — ABNORMAL LOW (ref 36.0–46.0)
Hemoglobin: 10.9 g/dL — ABNORMAL LOW (ref 12.0–15.0)
MCH: 32.2 pg (ref 26.0–34.0)
MCHC: 35 g/dL (ref 30.0–36.0)
MCV: 91.7 fL (ref 78.0–100.0)
PLATELETS: 222 10*3/uL (ref 150–400)
RBC: 3.39 MIL/uL — ABNORMAL LOW (ref 3.87–5.11)
RDW: 13.6 % (ref 11.5–15.5)
WBC: 10 10*3/uL (ref 4.0–10.5)

## 2016-05-12 NOTE — Progress Notes (Signed)
UR chart review completed.  

## 2016-05-12 NOTE — Lactation Note (Signed)
This note was copied from a baby's chart. Lactation Consultation Note: mother is experienced with breastfeeding. She states she breastfed and bottle fed each of her children for 6 months. Mother was lying skin to skin with infant. She has just given a bottle at . Mother advised to limit amts of formula given to infant to protect her milk supply.   instruct mother to feed infant 8-12 times in 24 hours. Lactation brochure given .  Patient Name: Boy Renda RollsLatonia Sivak [id number]'XToday's Date: 05/12/2016     Maternal Data    Feeding Feeding Type: Formula  LATCH Score/Interventions                      Lactation Tools Discussed/Used     Consult Status      Michel BickersKendrick, Rashauna Tep McCoy 05/12/2016, 5:33 PM

## 2016-05-12 NOTE — Progress Notes (Signed)
Katherine Serrano [id number]  Subjective: Postpartum Day 1: Primary LTC/S (with BTL) due to active HSV lesion Patient up ad lib, reports no syncope or dizziness.  "Very itchy" s/p spinal fentanyl. Feeding:  Breast Contraceptive plan:  BTL  Objective: Temp:  [97.4 F (36.3 C)-98.3 F (36.8 C)] 98.3 F (36.8 C) (09/15 0200) Pulse Rate:  [56-72] 67 (09/15 0200) Resp:  [7-20] 18 (09/15 0200) BP: (98-123)/(60-86) 110/62 (09/15 0200) SpO2:  [99 %-100 %] 100 % (09/15 0200)  CBC Latest Ref Rng & Units 05/12/2016 05/09/2016 09/09/2015  WBC 4.0 - 10.5 K/uL 10.0 7.0 5.1  Hemoglobin 12.0 - 15.0 g/dL 10.9(L) 11.6(L) 12.4  Hematocrit 36.0 - 46.0 % 31.1(L) 33.2(L) 34.5(L)  Platelets 150 - 400 K/uL 222 210 242     Physical Exam:  General: alert Lochia: appropriate Uterine Fundus: firm Abdomen:  + bowel sounds Incision: Pressure dressing CDI DVT Evaluation: No evidence of DVT seen on physical exam. Negative Homan's sign.   Assessment/Plan: Status post cesarean delivery, day 1. Stable Continue current care. May be interested in early d/c tomorrow.    Nigel BridgemanLATHAM, Katherine Nettleton MSN, CNM 05/12/2016, 6:24 AM

## 2016-05-13 LAB — TYPE AND SCREEN
ABO/RH(D): O NEG
ANTIBODY SCREEN: POSITIVE
DAT, IgG: NEGATIVE
UNIT DIVISION: 0
Unit division: 0

## 2016-05-13 MED ORDER — HYDROMORPHONE HCL 2 MG PO TABS: 2.0000 mg | ORAL_TABLET | ORAL | 0 refills | Status: AC | PRN

## 2016-05-13 MED ORDER — IBUPROFEN 600 MG PO TABS: 600.0000 mg | ORAL_TABLET | Freq: Four times a day (QID) | ORAL | 2 refills | Status: AC | PRN

## 2016-05-13 NOTE — Discharge Summary (Signed)
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 yo G3P3003 at 4254w0d was admitted to the hospital 05/11/2016 for scheduled cesarean section with the following indication:Active HSV.  Membrane Rupture Time/Date: 12:34 PM ,05/11/2016   Patient delivered a Viable infant.05/11/2016  Details of operation can be found in separate operative note.  Pateint had an uncomplicated postpartum course.  She is ambulating, tolerating a regular diet, passing flatus, and urinating well. Patient is discharged home in stable condition on  05/13/16          Physical Exam:  Vitals:   05/12/16 1100 05/12/16 1436 05/12/16 1847 05/13/16 0626  BP: (!) 120/52 (!) 101/59 99/65 (!) 107/58  Pulse: (!) 56 61 62 64  Resp: 18 18 16 18   Temp: 97.9 F (36.6 C) 98.5 F (36.9 C) 98.8 F (37.1 C) 98.4 F (36.9 C)  TempSrc: Oral Oral Oral Oral  SpO2:    100%  Weight:      Height:       General: alert Lochia: appropriate Uterine Fundus:  firm Incision: Dressing is clean, dry, and intact DVT Evaluation: No evidence of DVT seen on physical exam. Negative Homan's sign.  Labs:  CBC Latest Ref Rng & Units 05/12/2016 05/09/2016 09/09/2015  WBC 4.0 - 10.5 K/uL 10.0 7.0 5.1  Hemoglobin 12.0 - 15.0 g/dL 10.9(L) 11.6(L) 12.4  Hematocrit 36.0 - 46.0 % 31.1(L) 33.2(L) 34.5(L)  Platelets 150 - 400 K/uL 222 210 242     Discharge instruction: per After Visit Summary and "Baby and Me Booklet".  After Visit Meds:    Medication List    STOP taking these medications   valACYclovir 1000 MG tablet Commonly known as:  VALTREX     TAKE these medications   acetaminophen 500 MG tablet Commonly known as:  TYLENOL Take 500 mg by mouth every 6 (six) hours as needed for mild pain.   HYDROmorphone 2 MG tablet Commonly known as:  DILAUDID Take 1 tablet (2 mg total) by mouth every 4 (four) hours as needed for severe pain.   ibuprofen 600  MG tablet Commonly known as:  ADVIL,MOTRIN Take 1 tablet (600 mg total) by mouth every 6 (six) hours as needed.   prenatal multivitamin Tabs tablet Take 1 tablet by mouth daily at 12 noon.       Diet: routine diet  Activity: Advance as tolerated. Pelvic rest for 6 weeks.   Outpatient follow up:6 weeks Follow up Appt:No future appointments. Follow up visit: No Follow-up on file.  Postpartum contraception: Tubal Ligation  05/13/2016 Nigel Bridgeman, CNM

## 2016-05-13 NOTE — Discharge Instructions (Signed)

## 2016-05-13 NOTE — Lactation Note (Signed)
This note was copied from a baby's chart. Lactation Consultation Note: Mom reports baby has just finished feeding- reports she nursed the baby and gave formula afterwards because he was still hungry. Encouraged frequent nursing to promote a good milk supply. Mom reports when she did hand expression this morning the milk is looking different- more whitish. Reviewed engorgement prevention and treatment. Manual pump given with instructions for setup, use and cleaning of pump pieces. No questions at present. To call prn  Patient Name: Katherine [name] [id number]'UToday's Date: 05/13/2016 Reason for consult: Follow-up assessment   Maternal Data Formula Feeding for Exclusion: Yes Reason for exclusion: Mother's choice to formula and breast feed on admission Has patient been taught Hand Expression?: Yes Does the patient have breastfeeding experience prior to this delivery?: Yes  Feeding    LATCH Score/Interventions       Type of Nipple: Everted at rest and after stimulation  Comfort (Breast/Nipple): Soft / non-tender     Intervention(s): Breastfeeding basics reviewed     Lactation Tools Discussed/Used WIC Program: Yes Pump Review: Setup, frequency, and cleaning Initiated by:: DW Date initiated:: 05/13/16   Consult Status Consult Status: Complete    Pamelia HoitWeeks, Kandace Elrod D 05/13/2016, 9:14 AM

## 2016-05-15 ENCOUNTER — Encounter (HOSPITAL_COMMUNITY): Payer: Self-pay | Admitting: Obstetrics and Gynecology

## 2017-09-08 IMAGING — US US MFM OB LIMITED
1 series · 15 of 28 positions shown · non-contrast
Comparison: none

[Series 1: us mfm ob limited · 34 acquisitions, 15 frames shown]
[im 1/34]
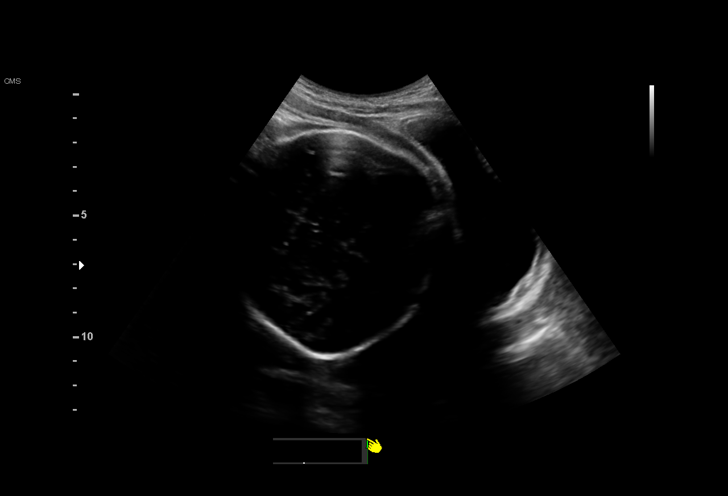
[im 3/34]
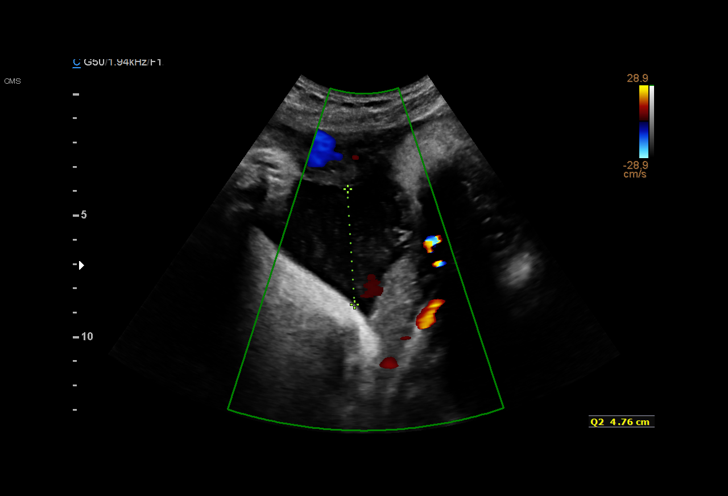
[im 5/34]
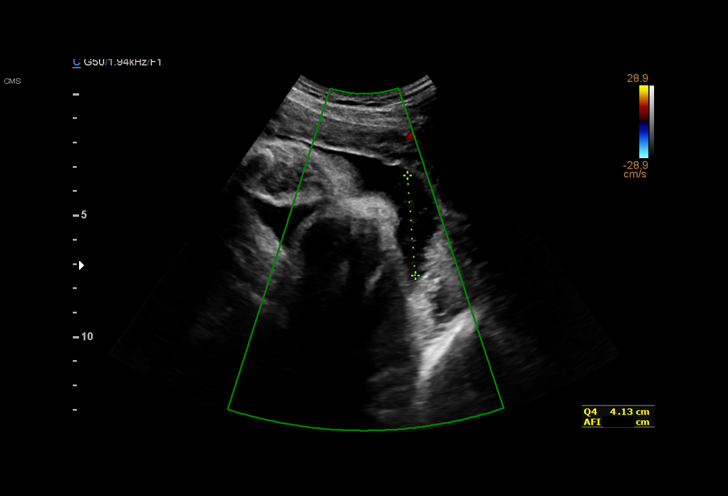
[im 8/34]
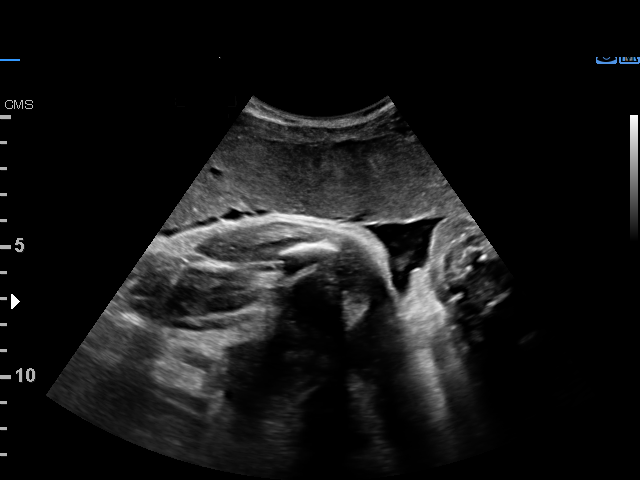
[im 10/34]
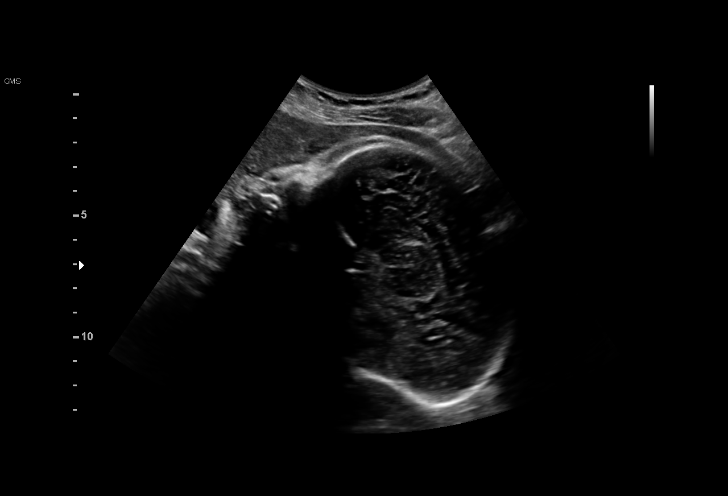
[im 13/34]
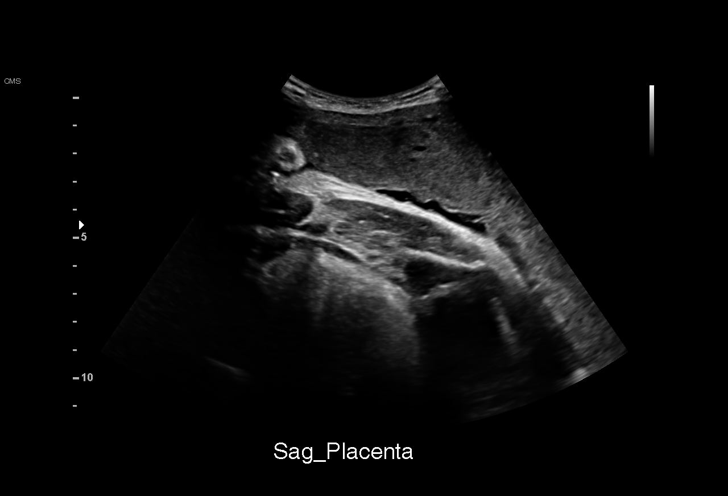
[im 15/34]
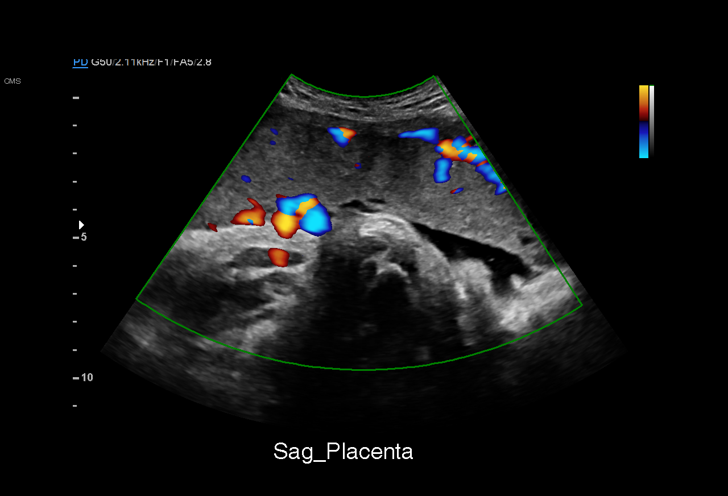
[im 18/34]
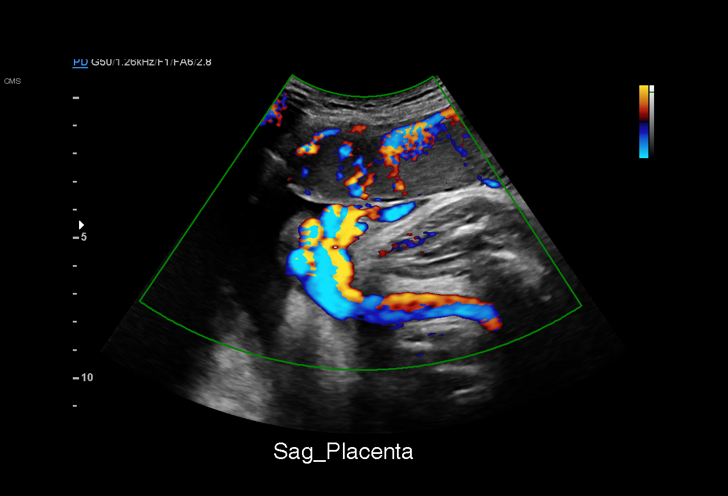
[im 19/34]
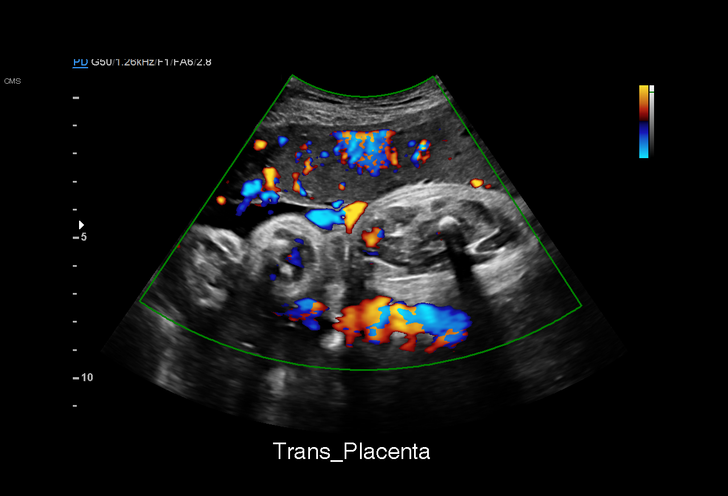
[im 21/34]
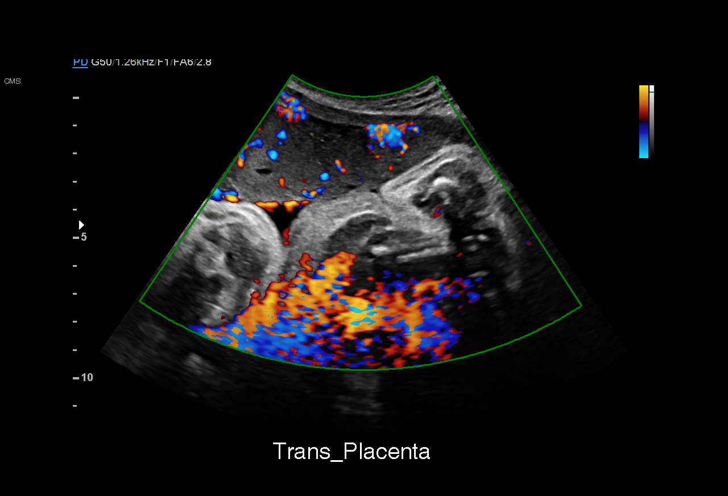
[im 24/34]
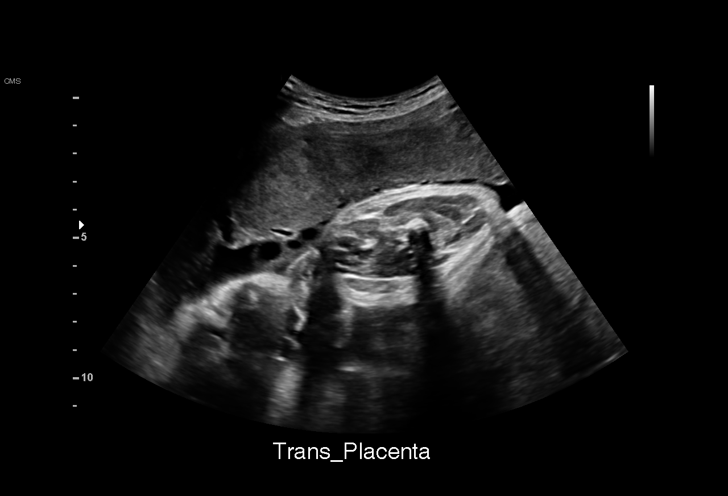
[im 26/34]
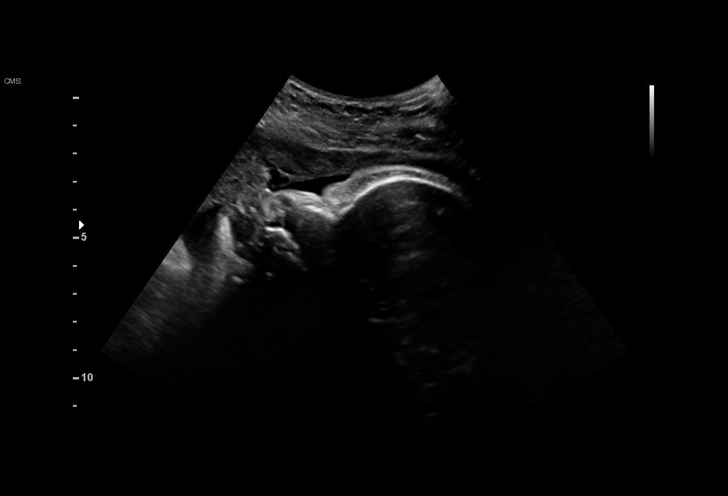
[im 29/34]
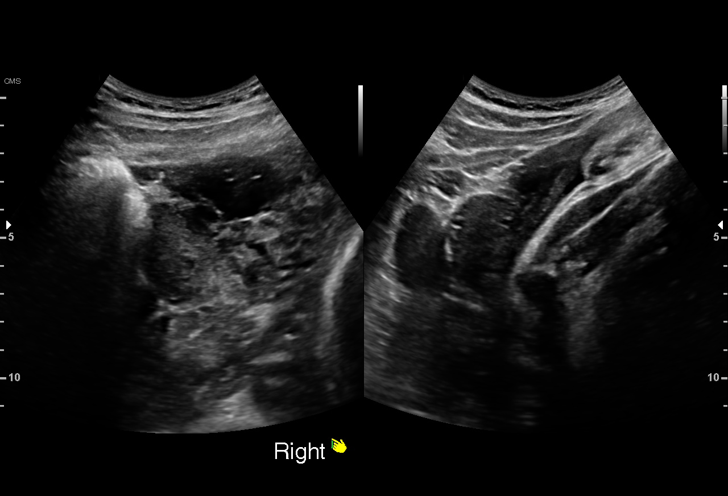
[im 31/34]
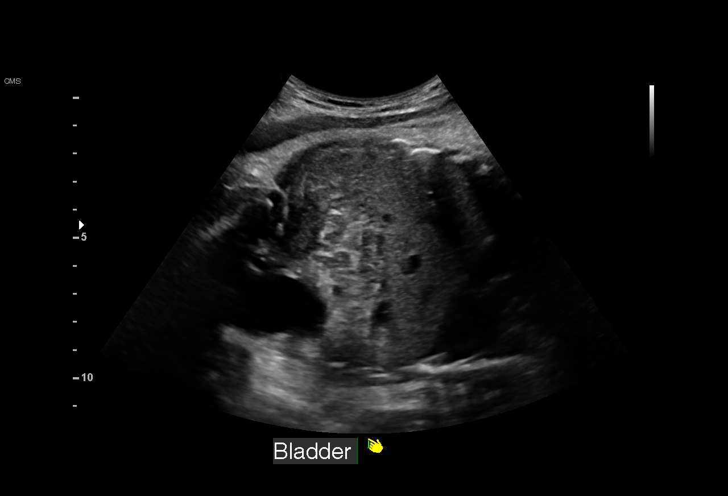
[im 34/34]
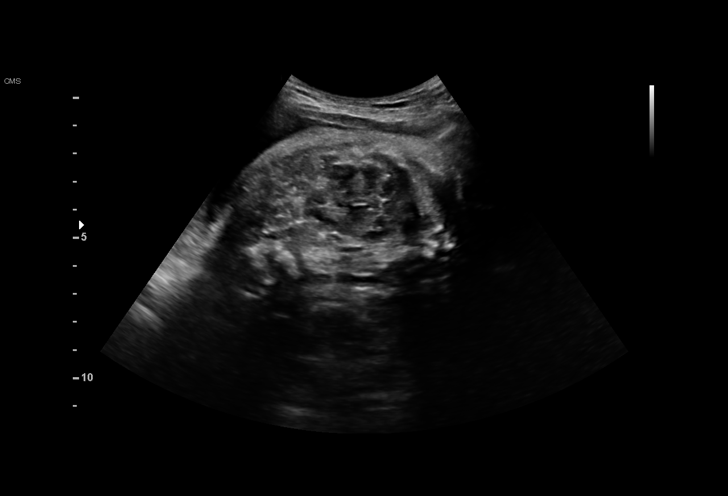

[15 of 28 positions shown; findings below may reference images not displayed]

MAU/Triage

1  ECHAI ZINGUI          [id number]      [id number]     [id number]
2  ECHAI ZINGUI          [id number]      [id number]     [id number]
Indications

38 weeks gestation of pregnancy
Traumatic injury during pregnancy
Non-reactive NST
OB History

Gravidity:    3         Term:   2        Prem:   0        SAB:   0
TOP:          0       Ectopic:  0        Living: 2
Fetal Evaluation

Num Of Fetuses:     1
Fetal Heart         130
Rate(bpm):
Cardiac Activity:   Observed
Presentation:       Cephalic
Placenta:           Anterior, above cervical os
P. Cord Insertion:  Visualized, central

Amniotic Fluid
AFI FV:      Subjectively within normal limits

AFI Sum(cm)     %Tile       Largest Pocket(cm)
13.78           54

RUQ(cm)       RLQ(cm)       LUQ(cm)        LLQ(cm)
3.32

Comment:    No placental abruption or previa identified.
Biophysical Evaluation

Amniotic F.V:   Within normal limits       F. Tone:        Observed
F. Movement:    Observed                   Score:          [DATE]
F. Breathing:   Not Observed
Gestational Age

LMP:           40w 0d       Date:   08/03/15                 EDD:   05/09/16
Clinical EDD:  38w 5d                                        EDD:   05/18/16
Best:          38w 5d    Det. By:   Clinical EDD             EDD:   05/18/16
Cervix Uterus Adnexa

Cervix
Not visualized (advanced GA >08wks)

Uterus
No abnormality visualized.

Left Ovary
Within normal limits.

Right Ovary
Within normal limits.

Cul De Sac:   No free fluid seen.

Adnexa:       No abnormality visualized.
Impression

SIUP at 38+5 weeks
Cephaic presentation
Normal amniotic fluid volume
BPP [DATE] (-2 for no BM)
Anterior placenta; no previa; no subchorionic fluid
collections/hemorrhage identified
Recommendations

Correlate with tracing and clinical scenario
